# Patient Record
Sex: Male | Born: 1985
Health system: Southern US, Community
[De-identification: ages and names within clinical notes are randomized; demographics above are authoritative.]

## PROBLEM LIST (undated history)

## (undated) ENCOUNTER — Emergency Department (HOSPITAL_COMMUNITY): Admission: EM | Payer: Medicare HMO

## (undated) DIAGNOSIS — G8929 Other chronic pain: Secondary | ICD-10-CM

## (undated) DIAGNOSIS — M1990 Unspecified osteoarthritis, unspecified site: Secondary | ICD-10-CM

## (undated) DIAGNOSIS — F419 Anxiety disorder, unspecified: Secondary | ICD-10-CM

## (undated) DIAGNOSIS — M79673 Pain in unspecified foot: Secondary | ICD-10-CM

## (undated) HISTORY — DX: Other chronic pain: G89.29

## (undated) HISTORY — PX: SUBTALAR DISTRACTION BONE BLOCK FUSION WITH ILIAC CREST BONE GRAFT: SHX6362

## (undated) HISTORY — PX: FRACTURE SURGERY: SHX138

## (undated) HISTORY — DX: Anxiety disorder, unspecified: F41.9

## (undated) HISTORY — DX: Unspecified osteoarthritis, unspecified site: M19.90

## (undated) HISTORY — DX: Pain in unspecified foot: M79.673

---

## 2011-02-18 ENCOUNTER — Other Ambulatory Visit: Payer: Self-pay | Admitting: Podiatry

## 2011-02-18 DIAGNOSIS — M79672 Pain in left foot: Secondary | ICD-10-CM

## 2011-02-20 ENCOUNTER — Other Ambulatory Visit: Payer: Self-pay

## 2011-02-20 ENCOUNTER — Ambulatory Visit
Admission: RE | Admit: 2011-02-20 | Discharge: 2011-02-20 | Disposition: A | Discharge status: Home / Self Care | Payer: BC Managed Care – PPO | Source: Ambulatory Visit | Attending: Podiatry | Admitting: Podiatry

## 2011-02-20 DIAGNOSIS — M79672 Pain in left foot: Secondary | ICD-10-CM

## 2011-04-04 ENCOUNTER — Ambulatory Visit (INDEPENDENT_AMBULATORY_CARE_PROVIDER_SITE_OTHER): Payer: BC Managed Care – PPO | Admitting: Family Medicine

## 2011-04-04 ENCOUNTER — Encounter: Payer: Self-pay | Admitting: Family Medicine

## 2011-04-04 DIAGNOSIS — Z9889 Other specified postprocedural states: Secondary | ICD-10-CM | POA: Insufficient documentation

## 2011-04-04 DIAGNOSIS — J069 Acute upper respiratory infection, unspecified: Secondary | ICD-10-CM

## 2011-04-04 DIAGNOSIS — Z20828 Contact with and (suspected) exposure to other viral communicable diseases: Secondary | ICD-10-CM

## 2011-04-04 MED ORDER — OSELTAMIVIR PHOSPHATE 75 MG PO CAPS: 75.0000 mg | ORAL_CAPSULE | Freq: Every day | ORAL | Status: AC

## 2011-04-04 NOTE — Assessment & Plan Note (Signed)
No evidence of bacterial infxn on PE.  Reviewed supportive care and red flags that should prompt return.  Pt expressed understanding and is in agreement w/ plan.

## 2011-04-04 NOTE — Patient Instructions (Signed)
Schedule your physical at your convenience We'll call you with your Ortho appt Start the Tamiflu daily to prevent flu illness The current symptoms are due to a virus- dayquil/nyquil, sudafed, etc will help Drink plenty of fluids REST! Call with any questions or concerns Think of [place] as your home base! Welcome!  We're glad to have you!!!

## 2011-04-04 NOTE — Assessment & Plan Note (Signed)
Start Tamiflu empirically

## 2011-04-04 NOTE — Assessment & Plan Note (Signed)
Refer to Dr Lestine Box for 2nd opinion on foot surgery

## 2011-04-04 NOTE — Progress Notes (Signed)
  Subjective:    Patient ID: Douglas Nelson, male    DOB: June 07, 1985, 26 y.o.   MRN: [id number]  HPI New to establish.  Previous MD-   L foot surgery- 5/12, still walking w/ cane and in pain.  Needs 2nd opinion b/c surgeon admitted 'it was messed up'  URI- + flu exposure.  Started w/ head congestion on Tuesday.  Cough productive of mucous.  No body aches.  No fevers.  No facial pain/pressure, no ear pain.   Review of Systems For ROS see HPI     Objective:   Physical Exam  Vitals reviewed. Constitutional: He appears well-developed and well-nourished. No distress.  HENT:  Head: Normocephalic and atraumatic.       No TTP over sinuses + turbinate edema + PND TMs normal bilaterally  Eyes: Conjunctivae and EOM are normal. Pupils are equal, round, and reactive to light.  Neck: Normal range of motion. Neck supple.  Cardiovascular: Normal rate, regular rhythm and normal heart sounds.   Pulmonary/Chest: Effort normal and breath sounds normal. No respiratory distress. He has no wheezes.  Lymphadenopathy:    He has no cervical adenopathy.  Skin: Skin is warm and dry.          Assessment & Plan:

## 2011-06-04 ENCOUNTER — Ambulatory Visit: Payer: BC Managed Care – PPO | Admitting: Family Medicine

## 2011-06-04 DIAGNOSIS — Z0289 Encounter for other administrative examinations: Secondary | ICD-10-CM

## 2011-06-14 ENCOUNTER — Ambulatory Visit (INDEPENDENT_AMBULATORY_CARE_PROVIDER_SITE_OTHER): Payer: BC Managed Care – PPO | Admitting: Family Medicine

## 2011-06-14 ENCOUNTER — Encounter: Payer: Self-pay | Admitting: Family Medicine

## 2011-06-14 VITALS — BP 128/80 | HR 83 | Temp 98.4°F | Ht 72.0 in | Wt 207.8 lb

## 2011-06-14 DIAGNOSIS — J309 Allergic rhinitis, unspecified: Secondary | ICD-10-CM

## 2011-06-14 DIAGNOSIS — J302 Other seasonal allergic rhinitis: Secondary | ICD-10-CM

## 2011-06-14 DIAGNOSIS — R631 Polydipsia: Secondary | ICD-10-CM

## 2011-06-14 LAB — HEPATIC FUNCTION PANEL
ALT: 24 U/L (ref 0–53)
AST: 32 U/L (ref 0–37)
Albumin: 4.9 g/dL (ref 3.5–5.2)
Alkaline Phosphatase: 72 U/L (ref 39–117)
Bilirubin, Direct: 0 mg/dL (ref 0.0–0.3)
Total Bilirubin: 1.4 mg/dL — ABNORMAL HIGH (ref 0.3–1.2)
Total Protein: 8 g/dL (ref 6.0–8.3)

## 2011-06-14 LAB — BASIC METABOLIC PANEL
Calcium: 10.4 mg/dL (ref 8.4–10.5)
GFR: 135.9 mL/min (ref >60.00)
Glucose, Bld: 75 mg/dL (ref 70–99)
Sodium: 138 mEq/L (ref 135–145)

## 2011-06-14 LAB — TSH: TSH: 0.68 u[IU]/mL (ref 0.35–5.50)

## 2011-06-14 NOTE — Progress Notes (Signed)
  Subjective:    Patient ID: Douglas Nelson, male    DOB: 15-Apr-1985, 26 y.o.   MRN: [id number]  HPI Polydipsia, polyuria.  Having excessive fatigue 10-20 minutes after eating.  Sleeping well, eating healthy, taking vitamins.  sxs for 'a couple months'.  Grandparents w/ DM II.  Mom w/ hypoglycemia.  Has lost between 8-15 lbs in last few months.  + polyphagia.  URI- nasal congestion is brown, green.  sxs started Sunday.  + sore throat.  + PND.  No cough.  No fevers.  + outdoor exposure.  Review of Systems For ROS see HPI     Objective:   Physical Exam  Vitals reviewed. Constitutional: He appears well-developed and well-nourished. No distress.  HENT:  Head: Normocephalic and atraumatic.       No TTP over sinuses + turbinate edema + PND TMs normal bilaterally  Eyes: Conjunctivae and EOM are normal. Pupils are equal, round, and reactive to light.  Neck: Normal range of motion. Neck supple. No thyromegaly present.  Cardiovascular: Normal rate, regular rhythm, normal heart sounds and intact distal pulses.   Pulmonary/Chest: Effort normal and breath sounds normal. No respiratory distress. He has no wheezes.  Abdominal: Soft. Bowel sounds are normal. He exhibits no distension. There is no tenderness. There is no rebound and no guarding.  Musculoskeletal: He exhibits no edema.  Lymphadenopathy:    He has no cervical adenopathy.  Skin: Skin is warm and dry.          Assessment & Plan:

## 2011-06-14 NOTE — Assessment & Plan Note (Signed)
New.  Pt to start OTC antihistamine for sxs relief.

## 2011-06-14 NOTE — Assessment & Plan Note (Signed)
New.  Pt w/ textbook diabetes sxs- polydipsia, polyuria, polyphagia, weight loss- although doesn't physically appear the way type II typically manifests.  Not acute and ill like type I.  Check labs to determine if endocrine abnormality.  Will follow closely.

## 2011-06-14 NOTE — Patient Instructions (Signed)
We'll notify you of your lab results and decide on the next step Start the Claritin or Zyrtec daily for the allergies Call with any questions or concerns Hang in there!

## 2011-06-18 ENCOUNTER — Encounter: Payer: Self-pay | Admitting: *Deleted

## 2011-07-16 ENCOUNTER — Other Ambulatory Visit: Payer: Self-pay | Admitting: Orthopedic Surgery

## 2011-07-16 DIAGNOSIS — M1990 Unspecified osteoarthritis, unspecified site: Secondary | ICD-10-CM

## 2011-07-18 ENCOUNTER — Ambulatory Visit
Admission: RE | Admit: 2011-07-18 | Discharge: 2011-07-18 | Disposition: A | Discharge status: Home / Self Care | Payer: BC Managed Care – PPO | Source: Ambulatory Visit | Attending: Orthopedic Surgery | Admitting: Orthopedic Surgery

## 2011-07-18 DIAGNOSIS — M1990 Unspecified osteoarthritis, unspecified site: Secondary | ICD-10-CM

## 2011-07-18 MED ORDER — IOHEXOL 180 MG/ML  SOLN
1.0000 mL | Freq: Once | INTRAMUSCULAR | Status: AC | PRN
  Administered 2011-07-18: 1 mL via INTRA_ARTICULAR

## 2011-07-18 MED ORDER — METHYLPREDNISOLONE ACETATE 40 MG/ML INJ SUSP (RADIOLOG
120.0000 mg | Freq: Once | INTRAMUSCULAR | Status: AC
  Administered 2011-07-18: 120 mg via INTRA_ARTICULAR

## 2011-09-16 DIAGNOSIS — Q6689 Other  specified congenital deformities of feet: Secondary | ICD-10-CM | POA: Insufficient documentation

## 2012-06-10 ENCOUNTER — Encounter: Payer: Self-pay | Admitting: Family Medicine

## 2012-06-10 ENCOUNTER — Ambulatory Visit (INDEPENDENT_AMBULATORY_CARE_PROVIDER_SITE_OTHER): Payer: BC Managed Care – PPO | Admitting: Family Medicine

## 2012-06-10 VITALS — BP 120/60 | HR 81 | Temp 98.1°F | Wt 204.8 lb

## 2012-06-10 DIAGNOSIS — Z308 Encounter for other contraceptive management: Secondary | ICD-10-CM

## 2012-06-10 DIAGNOSIS — Z9889 Other specified postprocedural states: Secondary | ICD-10-CM

## 2012-06-10 DIAGNOSIS — Z9189 Other specified personal risk factors, not elsewhere classified: Secondary | ICD-10-CM

## 2012-06-10 DIAGNOSIS — Z202 Contact with and (suspected) exposure to infections with a predominantly sexual mode of transmission: Secondary | ICD-10-CM | POA: Insufficient documentation

## 2012-06-10 NOTE — Patient Instructions (Addendum)
We'll get in touch w/ Dr Azzie Glatter and figure out the plan for the meds We'll notify you of your lab results Someone will call you with your urology appt Call with any questions or concerns CONGRATS on the engagement!

## 2012-06-10 NOTE — Assessment & Plan Note (Signed)
New.  Pt would like testing as part of an agreement between he and his fiance.  Check labs.

## 2012-06-10 NOTE — Progress Notes (Signed)
  Subjective:    Patient ID: Douglas Nelson, male    DOB: 1985-08-08, 27 y.o.   MRN: [id number]  HPI STD testing- pt reports he is not overly concerned but he and finance agreed to do this.  No discharge, no pain.  Pain management- pt had foot surgery at [facility] in December.  Saw Dr Azzie Glatter in [place] today (pain management) and pt was told to f/u here since he doesn't live in [place].    Pt desires vasectomy.   Review of Systems For ROS see HPI     Objective:   Physical Exam  Vitals reviewed. Constitutional: He is oriented to person, place, and time. He appears well-developed and well-nourished. No distress.  Neurological: He is alert and oriented to person, place, and time.  Skin: Skin is warm and dry.  Psychiatric: He has a normal mood and affect. His behavior is normal.          Assessment & Plan:

## 2012-06-10 NOTE — Assessment & Plan Note (Signed)
New.  Pain management requested pt f/u locally for pain meds.  Awaiting fax from Dr Azzie Glatter at [facility] to determine next steps.

## 2012-06-10 NOTE — Assessment & Plan Note (Signed)
New.  Pt desires vasectomy.  Will refer to urology.

## 2012-06-11 ENCOUNTER — Telehealth: Payer: Self-pay | Admitting: *Deleted

## 2012-06-11 LAB — GC/CHLAMYDIA PROBE AMP, URINE: GC Probe Amp, Urine: NEGATIVE

## 2012-06-11 LAB — HSV(HERPES SMPLX)ABS-I+II(IGG+IGM)-BLD
HSV 1 Glycoprotein G Ab, IgG: 2.16 IV — ABNORMAL HIGH
HSV 2 Glycoprotein G Ab, IgG: <0.1 IV
Herpes Simplex Vrs I&II-IgM Ab (EIA): 0.28 INDEX

## 2012-06-11 NOTE — Telephone Encounter (Signed)
I have not seen any forms from [facility]- could we please contact them again for the info

## 2012-06-11 NOTE — Telephone Encounter (Signed)
Pt called requesting a copy of lab results. Pt also wanted to check the status of the paperwork from [facility] pain mgmt to see if that has been handled. Pt is coming by the office tomorrow to pick up lab results (already placed at front desk) & would also like to pick up a prescription for oxycodone 10-325mg . Please advise.

## 2012-06-12 ENCOUNTER — Telehealth: Payer: Self-pay | Admitting: Family Medicine

## 2012-06-12 ENCOUNTER — Other Ambulatory Visit: Payer: Self-pay | Admitting: Family Medicine

## 2012-06-12 MED ORDER — OXYCODONE-ACETAMINOPHEN 10-325 MG PO TABS: 1.0000 | ORAL_TABLET | Freq: Two times a day (BID) | ORAL | Status: DC | PRN

## 2012-06-12 NOTE — Telephone Encounter (Signed)
Pt comes by the office today and informed him that we faxed record release to [facility] but have not hear anything from them.  Told the pt I will call them and fax the release again.  Informed the pt that Dr. Beverely Low will give him a rx for the Oxycodone 10-325mg  for now until we hear from [facility].  Pt agreed and rx given.  Faxed record release again.//AB/CMA

## 2012-06-12 NOTE — Telephone Encounter (Signed)
PT stated that his lab results and rx were suppose to be left up front for him yesterday. Please give him a call back thanks

## 2012-06-15 NOTE — Telephone Encounter (Signed)
Pt came in and picked up copy of lab results and also picked up rx for pain med.//AB/CMA

## 2012-06-17 ENCOUNTER — Other Ambulatory Visit: Payer: Self-pay | Admitting: Family Medicine

## 2012-06-17 MED ORDER — OXYCODONE HCL 5 MG PO TABS: 5.0000 mg | ORAL_TABLET | ORAL | Status: DC | PRN

## 2012-06-19 ENCOUNTER — Telehealth: Payer: Self-pay | Admitting: *Deleted

## 2012-06-19 NOTE — Telephone Encounter (Signed)
Angie had been in contact w/ [facility]- have yet to see the fax they were supposed to send.  Will wait for Angie to return.  We decreased his dose of Oxycontin to 5 mg and when he is next due for meds, will be happy to decrease to hydrocodone rather than oxycodone.

## 2012-06-19 NOTE — Telephone Encounter (Signed)
If pt would like to return the Oxycontin script we can replace it w/ Hydrocodone 5mg  TID prn.  #60, no refills

## 2012-06-19 NOTE — Telephone Encounter (Signed)
Spoke with Pt who states that this issue has already been addressed.

## 2012-06-19 NOTE — Telephone Encounter (Signed)
Pt left VM wanting to know if Dr Beverely Low has been in contact with Dr Arbutus Ped at [facility] about pain med. Pt states that Dr Arbutus Ped was wanting Pt to take some not as strong.Please advise

## 2012-07-09 ENCOUNTER — Telehealth: Payer: Self-pay | Admitting: *Deleted

## 2012-07-09 NOTE — Telephone Encounter (Signed)
Last OV 06-10-12, last filled 06-17-12 #60

## 2012-07-09 NOTE — Telephone Encounter (Signed)
Ok for #60 but please ask pt about whether he has an upcoming appt w/ Pain management for his psych testing as recommended at initial visit.

## 2012-07-10 MED ORDER — OXYCODONE HCL 5 MG PO TABS: 5.0000 mg | ORAL_TABLET | ORAL | Status: DC | PRN

## 2012-07-10 NOTE — Telephone Encounter (Signed)
Spoke with the pt and informed him the med refill request has been approved.  Informed the pt that we received the records from [facility] and from the notes he was to have an UDS and med psych eval.  Asked the pt was this done, and has he had the f/u with [facility] 1st week of May with the plan for Dr. Beverely Low to assume care after.  Pt stated that the UDS was done on (06/10/12), and he got a call from [facility] on yest and they are scheduling him a appt looking at Tues (07/14/12).  They are trying to get his f/u and med psych eval the same day.  Informed him his rx will be put up front for him to pick-up.//AB/CMA

## 2012-07-28 ENCOUNTER — Telehealth: Payer: Self-pay | Admitting: General Practice

## 2012-07-28 MED ORDER — OXYCODONE HCL 5 MG PO TABS: 5.0000 mg | ORAL_TABLET | ORAL | Status: DC | PRN

## 2012-07-28 NOTE — Telephone Encounter (Signed)
Pt called stating that he needs a refill of his Oxycodone. Pt last seen on 06-10-12 and med last filled on 07-10-12 #60. Please advise.

## 2012-07-28 NOTE — Telephone Encounter (Signed)
Ok for #60, no refills 

## 2012-07-28 NOTE — Telephone Encounter (Signed)
Rx printed and put up front for the pt to pick-up.   Spoke with the pt and informed him that rx is ready to be picked up.//AB/CMA

## 2012-08-12 ENCOUNTER — Telehealth: Payer: Self-pay | Admitting: General Practice

## 2012-08-12 NOTE — Telephone Encounter (Signed)
Is he taking this regularly every 4-6 hr or only as needed for severe pain.  If taking regularly, we need to switch him to extended release tabs.  If it's only as needed, should not be requiring 60 pills in 15 days

## 2012-08-12 NOTE — Telephone Encounter (Signed)
oxyCODONE (OXY IR/ROXICODONE) 5 MG immediate release tablet Last OV 06-10-12 Med last filled on 07-28-12 #60 with 0 refills

## 2012-08-13 NOTE — Telephone Encounter (Signed)
Pt states he takes med every 4 hours.

## 2012-08-14 MED ORDER — OXYCODONE HCL ER 10 MG PO T12A: 10.0000 mg | EXTENDED_RELEASE_TABLET | Freq: Two times a day (BID) | ORAL | Status: DC

## 2012-08-14 NOTE — Telephone Encounter (Signed)
Pt notified Rx available at the front desk for pick up.

## 2012-08-14 NOTE — Telephone Encounter (Signed)
Based on the fact that he's requiring meds every 4 hrs, needs to switch to Oxycontin ER 10mg  BID, #60 no refills

## 2012-08-26 ENCOUNTER — Telehealth: Payer: Self-pay | Admitting: *Deleted

## 2012-08-26 NOTE — Telephone Encounter (Signed)
We can complete a form for him- please get form and I will sign

## 2012-08-26 NOTE — Telephone Encounter (Signed)
Pt left VM that he would like to get a form competed to apply for a handicap sticker.Pt notes that he has already been parking in the spots already but would like to make it official .Please advise

## 2012-08-27 NOTE — Telephone Encounter (Signed)
Formed placed up front for pick up left Pt detail VM

## 2012-08-31 ENCOUNTER — Ambulatory Visit (INDEPENDENT_AMBULATORY_CARE_PROVIDER_SITE_OTHER): Payer: BC Managed Care – PPO | Admitting: Family Medicine

## 2012-08-31 ENCOUNTER — Encounter: Payer: Self-pay | Admitting: Family Medicine

## 2012-08-31 VITALS — BP 110/70 | HR 80 | Temp 98.3°F | Wt 188.6 lb

## 2012-08-31 DIAGNOSIS — J019 Acute sinusitis, unspecified: Secondary | ICD-10-CM

## 2012-08-31 MED ORDER — AMOXICILLIN 875 MG PO TABS: 875.0000 mg | ORAL_TABLET | Freq: Two times a day (BID) | ORAL | Status: DC

## 2012-08-31 MED ORDER — PROMETHAZINE-DM 6.25-15 MG/5ML PO SYRP: 5.0000 mL | ORAL_SOLUTION | Freq: Four times a day (QID) | ORAL | Status: DC | PRN

## 2012-08-31 NOTE — Progress Notes (Signed)
  Subjective:    Patient ID: Douglas Nelson, male    DOB: [birth year], 27 y.o.   MRN: [id number]  HPI URI- sxs started 1 week ago.  No fevers.  + chills.  + nasal congestion, PND, sore throat.  + facial pain/pressure.  + hoarseness.  Started cough last night- productive.  No known sick contacts.  Painful to lean forward, no tooth pain.  No N/V/D.   Review of Systems For ROS see HPI     Objective:   Physical Exam  Vitals reviewed. Constitutional: He appears well-developed and well-nourished. No distress.  HENT:  Head: Normocephalic and atraumatic.  Right Ear: Tympanic membrane normal.  Left Ear: Tympanic membrane normal.  Nose: Mucosal edema and rhinorrhea present. Right sinus exhibits maxillary sinus tenderness and frontal sinus tenderness. Left sinus exhibits maxillary sinus tenderness and frontal sinus tenderness.  Mouth/Throat: Mucous membranes are normal. Oropharyngeal exudate and posterior oropharyngeal erythema present. No posterior oropharyngeal edema.  + PND  Eyes: Conjunctivae and EOM are normal. Pupils are equal, round, and reactive to light.  Neck: Normal range of motion. Neck supple.  Cardiovascular: Normal rate, regular rhythm and normal heart sounds.   Pulmonary/Chest: Effort normal and breath sounds normal. No respiratory distress. He has no wheezes.  + hacking cough  Lymphadenopathy:    He has no cervical adenopathy.  Skin: Skin is warm and dry.          Assessment & Plan:

## 2012-08-31 NOTE — Patient Instructions (Addendum)
This is a sinus infection Start the Amox twice daily- take w/ food Drink plenty of fluids Use the cough syrup at night- will make you drowsy Mucinex DM for daytime cough REST! Hang in there!!!

## 2012-08-31 NOTE — Assessment & Plan Note (Signed)
New.  sxs and PE consistent w/ infxn.  Start abx.  Cough meds prn.  Reviewed supportive care and red flags that should prompt return.  Pt expressed understanding and is in agreement w/ plan.

## 2012-09-21 ENCOUNTER — Telehealth: Payer: Self-pay | Admitting: *Deleted

## 2012-09-21 ENCOUNTER — Other Ambulatory Visit: Payer: Self-pay | Admitting: Family Medicine

## 2012-09-21 MED ORDER — OXYCODONE HCL 5 MG PO TABS: 5.0000 mg | ORAL_TABLET | Freq: Three times a day (TID) | ORAL | Status: DC | PRN

## 2012-09-21 NOTE — Progress Notes (Signed)
Script printed.

## 2012-09-21 NOTE — Telephone Encounter (Signed)
Pt left detail VM Rx ready for pick up Per Angie. rx placed up front.

## 2012-09-21 NOTE — Telephone Encounter (Signed)
Pt left VM that he would like to switch back to the oxycodone. Pt indicated that the oxycontin is too strong and he does not like the way it makes him feels. Pt notes that it does help the pain but it is too strong so he would like to go back to the oxycodone and take motrin for the break thru pain.Please advise

## 2012-09-21 NOTE — Telephone Encounter (Signed)
Script printed for short acting meds.  Pt will take this 3x/day rather than every 4 hrs like before and then motrin as needed- pt needs UDS

## 2012-09-22 ENCOUNTER — Encounter: Payer: Self-pay | Admitting: Lab

## 2012-10-02 ENCOUNTER — Encounter: Payer: Self-pay | Admitting: Family Medicine

## 2012-10-23 ENCOUNTER — Telehealth: Payer: Self-pay | Admitting: *Deleted

## 2012-10-23 DIAGNOSIS — G894 Chronic pain syndrome: Secondary | ICD-10-CM

## 2012-10-23 MED ORDER — OXYCODONE HCL 5 MG PO TABS: 5.0000 mg | ORAL_TABLET | Freq: Three times a day (TID) | ORAL | Status: DC | PRN

## 2012-10-23 NOTE — Telephone Encounter (Signed)
Rx printed and pt notified it is ready to be picked up

## 2012-10-23 NOTE — Telephone Encounter (Signed)
Ok for #90, no refills 

## 2012-10-23 NOTE — Telephone Encounter (Signed)
Patient is requesting refill on Oxycodone 5mg   Last OV 08/31/12 Last filled 09/21/12 #90 UDS-Low Risk Agreement on file  Okay to refill?

## 2012-11-23 ENCOUNTER — Other Ambulatory Visit: Payer: Self-pay | Admitting: General Practice

## 2012-11-23 DIAGNOSIS — G894 Chronic pain syndrome: Secondary | ICD-10-CM

## 2012-11-23 MED ORDER — OXYCODONE HCL 5 MG PO TABS: 5.0000 mg | ORAL_TABLET | Freq: Three times a day (TID) | ORAL | Status: DC | PRN

## 2012-11-23 NOTE — Telephone Encounter (Signed)
NOted pt advised.

## 2012-11-23 NOTE — Telephone Encounter (Signed)
oxyCODONE (OXY IR/ROXICODONE) 5 MG immediate release tablet Last OV 08-31-12 Med last filled 10-23-12 #90 with 0 refills.   Controlled Substance Contract and UDS up to date Low Risk

## 2012-12-14 DIAGNOSIS — Z981 Arthrodesis status: Secondary | ICD-10-CM | POA: Diagnosis not present

## 2012-12-14 DIAGNOSIS — Z9889 Other specified postprocedural states: Secondary | ICD-10-CM | POA: Diagnosis not present

## 2012-12-14 DIAGNOSIS — M79609 Pain in unspecified limb: Secondary | ICD-10-CM | POA: Diagnosis not present

## 2012-12-14 DIAGNOSIS — IMO0002 Reserved for concepts with insufficient information to code with codable children: Secondary | ICD-10-CM | POA: Diagnosis not present

## 2012-12-14 DIAGNOSIS — M76899 Other specified enthesopathies of unspecified lower limb, excluding foot: Secondary | ICD-10-CM | POA: Diagnosis not present

## 2012-12-28 ENCOUNTER — Telehealth: Payer: Self-pay | Admitting: *Deleted

## 2012-12-28 DIAGNOSIS — G894 Chronic pain syndrome: Secondary | ICD-10-CM

## 2012-12-28 MED ORDER — OXYCODONE HCL 5 MG PO TABS: 5.0000 mg | ORAL_TABLET | Freq: Three times a day (TID) | ORAL | Status: DC | PRN

## 2012-12-28 NOTE — Telephone Encounter (Signed)
Med refilled.

## 2012-12-28 NOTE — Telephone Encounter (Signed)
Last visit: 08/31/2012  Last filled: 11/23/2012, #90, 0 refills  UDS: 09/22/2012-low risk, contract signed  Please advise. SW, CMA

## 2012-12-28 NOTE — Telephone Encounter (Signed)
Refill x1 

## 2013-01-26 ENCOUNTER — Ambulatory Visit (INDEPENDENT_AMBULATORY_CARE_PROVIDER_SITE_OTHER): Payer: Medicare Other | Admitting: Family Medicine

## 2013-01-26 ENCOUNTER — Encounter: Payer: Self-pay | Admitting: Family Medicine

## 2013-01-26 VITALS — BP 130/60 | HR 72 | Temp 98.1°F | Wt 199.0 lb

## 2013-01-26 DIAGNOSIS — J069 Acute upper respiratory infection, unspecified: Secondary | ICD-10-CM | POA: Diagnosis not present

## 2013-01-26 MED ORDER — FLUTICASONE PROPIONATE 50 MCG/ACT NA SUSP: 2.0000 | Freq: Every day | NASAL | Status: DC

## 2013-01-26 MED ORDER — LORATADINE 10 MG PO TABS: 10.0000 mg | ORAL_TABLET | Freq: Every day | ORAL | Status: DC

## 2013-01-26 MED ORDER — FLUTICASONE PROPIONATE 50 MCG/ACT NA SUSP: NASAL | Status: DC

## 2013-01-26 NOTE — Progress Notes (Signed)
  Subjective:     Douglas Nelson is a 27 y.o. male who presents for evaluation of symptoms of a URI. Symptoms include bilateral ear pressure/pain, facial pain, nasal congestion, post nasal drip and sinus pressure. Onset of symptoms was 8 hours ago, and has been gradually worsening since that time. Treatment to date: none.  The following portions of the patient's history were reviewed and updated as appropriate: allergies, current medications, past family history, past medical history, past social history, past surgical history and problem list.  Review of Systems Pertinent items are noted in HPI.   Objective:    BP 130/60  Pulse 72  Temp(Src) 98.1 F (36.7 C) (Oral)  Wt 199 lb (90.266 kg)  SpO2 98% General appearance: alert, cooperative, appears stated age and no distress Ears: normal TM's and external ear canals both ears Nose: clear discharge, mild congestion, turbinates red, swollen, sinus tenderness bilateral Throat: abnormal findings: mild oropharyngeal erythema and pnd Neck: no adenopathy, supple, symmetrical, trachea midline and thyroid not enlarged, symmetric, no tenderness/mass/nodules Lungs: clear to auscultation bilaterally Heart: S1, S2 normal   Assessment:    viral upper respiratory illness   Plan:    Discussed diagnosis and treatment of URI. Suggested symptomatic OTC remedies. Nasal saline spray for congestion. Nasal steroids per orders. Follow up as needed. claritin or other antihistamine

## 2013-01-26 NOTE — Progress Notes (Signed)
Pre visit review using our clinic review tool, if applicable. No additional management support is needed unless otherwise documented below in the visit note. 

## 2013-01-26 NOTE — Patient Instructions (Signed)

## 2013-02-09 ENCOUNTER — Telehealth: Payer: Self-pay | Admitting: *Deleted

## 2013-02-09 DIAGNOSIS — G894 Chronic pain syndrome: Secondary | ICD-10-CM

## 2013-02-09 MED ORDER — OXYCODONE HCL 5 MG PO TABS: 5.0000 mg | ORAL_TABLET | Freq: Three times a day (TID) | ORAL | Status: DC | PRN

## 2013-02-09 NOTE — Telephone Encounter (Signed)
Last seen-01/26/2013  Last filled-12/28/2012  UDS-09/22/2012 low risk, contract signed  Please advise. SW

## 2013-02-09 NOTE — Telephone Encounter (Signed)
Med filled. Patient notified

## 2013-02-09 NOTE — Telephone Encounter (Signed)
Ok for refill? 

## 2013-03-24 ENCOUNTER — Telehealth: Payer: Self-pay | Admitting: *Deleted

## 2013-03-24 DIAGNOSIS — G894 Chronic pain syndrome: Secondary | ICD-10-CM

## 2013-03-24 MED ORDER — OXYCODONE HCL 5 MG PO TABS: 5.0000 mg | ORAL_TABLET | Freq: Three times a day (TID) | ORAL | Status: DC | PRN

## 2013-03-24 NOTE — Telephone Encounter (Signed)
Ok for 90

## 2013-03-24 NOTE — Telephone Encounter (Signed)
oxyCODONE (OXY IR/ROXICODONE) 5 MG immediate release tablet Last refill: 02/09/13 #90, 0 refills Last OV: 01/26/2013 Last UDS: 09/22/12, low risk

## 2013-03-24 NOTE — Telephone Encounter (Signed)
Med filled, pt called cannot leave a message due to voicemail not set up.

## 2013-03-24 NOTE — Telephone Encounter (Signed)
Med filled, pt notified.  

## 2013-05-20 ENCOUNTER — Telehealth: Payer: Self-pay | Admitting: *Deleted

## 2013-05-20 DIAGNOSIS — G894 Chronic pain syndrome: Secondary | ICD-10-CM

## 2013-05-20 MED ORDER — OXYCODONE HCL 5 MG PO TABS: 5.0000 mg | ORAL_TABLET | Freq: Three times a day (TID) | ORAL | Status: DC | PRN

## 2013-05-20 NOTE — Addendum Note (Signed)
Addended by: Jackson LatinoYLER, JESSICA L on: 05/20/2013 04:57 PM   Modules accepted: Orders

## 2013-05-20 NOTE — Telephone Encounter (Signed)
Ok for #90, no refills 

## 2013-05-20 NOTE — Telephone Encounter (Signed)
Patient left message on triage line requesting refill on oxycodone. Last OV 01/26/13 Last filled 03/24/13 #90 Contract on file UDS low risk Okay to refill?

## 2013-05-20 NOTE — Telephone Encounter (Signed)
Med filled.  

## 2013-07-06 ENCOUNTER — Telehealth: Payer: Self-pay | Admitting: Family Medicine

## 2013-07-06 DIAGNOSIS — G894 Chronic pain syndrome: Secondary | ICD-10-CM

## 2013-07-06 NOTE — Telephone Encounter (Signed)
Caller name:Yerick Clifton CustardAaron  Relation to ZO:XWRUEAVpt:patient Call back number:[phone] Pharmacy:  Reason for call: to request a refill for oxyCODONE (OXY IR/ROXICODONE) 5 MG immediate release tablet

## 2013-07-07 DIAGNOSIS — M79609 Pain in unspecified limb: Secondary | ICD-10-CM | POA: Diagnosis not present

## 2013-07-07 DIAGNOSIS — M25579 Pain in unspecified ankle and joints of unspecified foot: Secondary | ICD-10-CM | POA: Diagnosis not present

## 2013-07-07 MED ORDER — OXYCODONE HCL 5 MG PO TABS: 5.0000 mg | ORAL_TABLET | Freq: Three times a day (TID) | ORAL | Status: DC | PRN

## 2013-07-07 NOTE — Telephone Encounter (Signed)
Patient requesting refill on Oxy-IR Last OV 01/26/13 Last filled 05/20/13 #90 0 RF Contract on file UDS low risk  Okay to refill?

## 2013-07-07 NOTE — Telephone Encounter (Signed)
Med filled.  

## 2013-07-07 NOTE — Telephone Encounter (Signed)
Ok for #90, no refills 

## 2013-09-02 ENCOUNTER — Telehealth: Payer: Self-pay | Admitting: Family Medicine

## 2013-09-02 DIAGNOSIS — G894 Chronic pain syndrome: Secondary | ICD-10-CM

## 2013-09-02 MED ORDER — OXYCODONE HCL 5 MG PO TABS: 5.0000 mg | ORAL_TABLET | Freq: Three times a day (TID) | ORAL | Status: DC | PRN

## 2013-09-02 NOTE — Telephone Encounter (Signed)
Caller name: Douglas Nelson Relation to pt: patient Call back number: [phone] Pharmacy:  Reason for call: to request a refill for  oxyCODONE (OXY IR/ROXICODONE) 5 MG immediate

## 2013-09-02 NOTE — Telephone Encounter (Signed)
Last OV 08-31-12 (sinus) 07-07-13 #90 with 0

## 2013-09-02 NOTE — Telephone Encounter (Signed)
Med filled.  

## 2013-09-02 NOTE — Telephone Encounter (Signed)
Ok for 90

## 2013-10-28 ENCOUNTER — Telehealth: Payer: Self-pay | Admitting: Family Medicine

## 2013-10-28 DIAGNOSIS — G894 Chronic pain syndrome: Secondary | ICD-10-CM

## 2013-10-28 MED ORDER — OXYCODONE HCL 5 MG PO TABS: 5.0000 mg | ORAL_TABLET | Freq: Three times a day (TID) | ORAL | Status: DC | PRN

## 2013-10-28 NOTE — Telephone Encounter (Signed)
Caller name: [place] Relation to pt: Call back number:[phone] Pharmacy:  Reason for call: wants refill on rx oxyCODONE (OXY IR/ROXICODONE) 5 MG immediate release tablet

## 2013-10-28 NOTE — Telephone Encounter (Signed)
Med filled and pt notified.  

## 2013-10-28 NOTE — Telephone Encounter (Signed)
Last OV 08-31-12 Oxy filled 09-02-13 #90 with 0  Due for UDS

## 2013-10-28 NOTE — Telephone Encounter (Signed)
Ok for #90, needs UDS.  No additional refills w/o appt (has been over 1 year)

## 2013-11-08 ENCOUNTER — Encounter: Payer: Self-pay | Admitting: Family Medicine

## 2013-11-08 ENCOUNTER — Ambulatory Visit (INDEPENDENT_AMBULATORY_CARE_PROVIDER_SITE_OTHER): Payer: Medicare Other | Admitting: Family Medicine

## 2013-11-08 VITALS — BP 112/80 | HR 67 | Temp 98.2°F | Resp 16 | Ht 71.0 in | Wt 204.4 lb

## 2013-11-08 DIAGNOSIS — E663 Overweight: Secondary | ICD-10-CM | POA: Diagnosis not present

## 2013-11-08 DIAGNOSIS — Z9889 Other specified postprocedural states: Secondary | ICD-10-CM | POA: Diagnosis not present

## 2013-11-08 DIAGNOSIS — Z0000 Encounter for general adult medical examination without abnormal findings: Secondary | ICD-10-CM | POA: Diagnosis not present

## 2013-11-08 DIAGNOSIS — Z6825 Body mass index (BMI) 25.0-25.9, adult: Secondary | ICD-10-CM | POA: Diagnosis not present

## 2013-11-08 DIAGNOSIS — Z79899 Other long term (current) drug therapy: Secondary | ICD-10-CM

## 2013-11-08 LAB — BASIC METABOLIC PANEL
BUN: 16 mg/dL (ref 6–23)
CO2: 31 meq/L (ref 19–32)
CREATININE: 0.9 mg/dL (ref 0.4–1.5)
Calcium: 9.8 mg/dL (ref 8.4–10.5)
Chloride: 100 mEq/L (ref 96–112)
GFR: 126.7 mL/min (ref >60.00)
GLUCOSE: 74 mg/dL (ref 70–99)
Potassium: 3.7 mEq/L (ref 3.5–5.1)
Sodium: 138 mEq/L (ref 135–145)

## 2013-11-08 LAB — LIPID PANEL
CHOLESTEROL: 252 mg/dL — AB (ref 0–200)
HDL: 65.7 mg/dL (ref >39.00)
LDL Cholesterol: 166 mg/dL — ABNORMAL HIGH (ref 0–99)
NonHDL: 186.3
Total CHOL/HDL Ratio: 4
Triglycerides: 100 mg/dL (ref 0.0–149.0)
VLDL: 20 mg/dL (ref 0.0–40.0)

## 2013-11-08 LAB — HEPATIC FUNCTION PANEL
ALBUMIN: 4.3 g/dL (ref 3.5–5.2)
ALT: 30 U/L (ref 0–53)
AST: 41 U/L — ABNORMAL HIGH (ref 0–37)
Alkaline Phosphatase: 58 U/L (ref 39–117)
Bilirubin, Direct: 0.1 mg/dL (ref 0.0–0.3)
TOTAL PROTEIN: 7.6 g/dL (ref 6.0–8.3)
Total Bilirubin: 1.6 mg/dL — ABNORMAL HIGH (ref 0.2–1.2)

## 2013-11-08 NOTE — Progress Notes (Signed)
Pre visit review using our clinic review tool, if applicable. No additional management support is needed unless otherwise documented below in the visit note. 

## 2013-11-08 NOTE — Assessment & Plan Note (Signed)
Pt now w/ chronic pain after multiple foot surgeries.  Maintained on narcotics.  Will continue to prescribe for pt.

## 2013-11-08 NOTE — Progress Notes (Signed)
   Subjective:    Patient ID: Douglas Nelson, male    DOB: August 27, 1985, 28 y.o.   MRN: [id number]  HPI Here today for CPE.  Risk Factors: Chronic foot pain- managing w/ chronic narcotics Physical Activity: limited due to foot pain and previous surgery Fall Risk: low Depression: denies current symptoms Hearing: normal to conversational tones and whispered voice at 6 ft ADL's: independent Cognitive: normal linear thought process, memory and attention intact Home Safety: safe at home, lives w/ wife Height, Weight, BMI, Visual Acuity: see vitals, vision corrected to 20/20 w/ glasses Counseling: UTD on eye exam, too young for colonoscopy or prostate exam Labs Ordered: See A&P Care Plan: See A&P    Review of Systems Patient reports no vision/hearing changes, anorexia, fever ,adenopathy, persistant/recurrent hoarseness, swallowing issues, chest pain, palpitations, edema, persistant/recurrent cough, hemoptysis, dyspnea (rest,exertional, paroxysmal nocturnal), gastrointestinal  bleeding (melena, rectal bleeding), abdominal pain, excessive heart burn, GU symptoms (dysuria, hematuria, voiding/incontinence issues) syncope, focal weakness, memory loss, numbness & tingling, skin/hair/nail changes, depression, anxiety, abnormal bruising/bleeding, musculoskeletal symptoms/signs.     Objective:   Physical Exam General Appearance:    Alert, cooperative, no distress, appears stated age  Head:    Normocephalic, without obvious abnormality, atraumatic  Eyes:    PERRL, conjunctiva/corneas clear, EOM's intact, fundi    benign, both eyes       Ears:    Normal TM's and external ear canals, both ears  Nose:   Nares normal, septum midline, mucosa normal, no drainage   or sinus tenderness  Throat:   Lips, mucosa, and tongue normal; teeth and gums normal  Neck:   Supple, symmetrical, trachea midline, no adenopathy;       thyroid:  No enlargement/tenderness/nodules  Back:     Symmetric, no curvature, ROM  normal, no CVA tenderness  Lungs:     Clear to auscultation bilaterally, respirations unlabored  Chest wall:    No tenderness or deformity  Heart:    Regular rate and rhythm, S1 and S2 normal, no murmur, rub   or gallop  Abdomen:     Soft, non-tender, bowel sounds active all four quadrants,    no masses, no organomegaly  Genitalia:    Normal male without lesion, masses,discharge or tenderness  Rectal:    Deferred due to young age  Extremities:   Extremities normal, atraumatic, no cyanosis or edema  Pulses:   2+ and symmetric all extremities  Skin:   Skin color, texture, turgor normal, no rashes or lesions  Lymph nodes:   Cervical, supraclavicular, and axillary nodes normal  Neurologic:   CNII-XII intact. Normal strength, sensation and reflexes      throughout          Assessment & Plan:

## 2013-11-08 NOTE — Patient Instructions (Signed)
Follow up in 1 year or as needed Keep up the good work!  You look great! We'll notify you of your lab results and make any changes if needed Call with any questions or concerns Happy Labor Day!

## 2013-11-08 NOTE — Assessment & Plan Note (Signed)
New.  Suspect this BMI is actually inaccurate due to pt's extensive muscle mass.  Will check labs to risk stratify.  Will follow.

## 2013-11-08 NOTE — Assessment & Plan Note (Signed)
Pt's PE WNL.  Too young for colonoscopy or DRE/PSA.  Check labs.  Anticipatory guidance provided.

## 2013-11-09 ENCOUNTER — Encounter: Payer: Self-pay | Admitting: General Practice

## 2013-12-16 DIAGNOSIS — M79672 Pain in left foot: Secondary | ICD-10-CM | POA: Diagnosis not present

## 2013-12-16 DIAGNOSIS — Z9889 Other specified postprocedural states: Secondary | ICD-10-CM | POA: Diagnosis not present

## 2013-12-16 DIAGNOSIS — Z981 Arthrodesis status: Secondary | ICD-10-CM | POA: Diagnosis not present

## 2013-12-27 ENCOUNTER — Telehealth: Payer: Self-pay | Admitting: Family Medicine

## 2013-12-27 DIAGNOSIS — G894 Chronic pain syndrome: Secondary | ICD-10-CM

## 2013-12-27 MED ORDER — OXYCODONE HCL 5 MG PO TABS: 5.0000 mg | ORAL_TABLET | Freq: Three times a day (TID) | ORAL | Status: DC | PRN

## 2013-12-27 NOTE — Telephone Encounter (Signed)
Med filled and pt notified.  

## 2013-12-27 NOTE — Telephone Encounter (Signed)
Caller name: Kurka, Swazil[place]Jordan M Relation to pt: self  Call back number: [phone]   Reason for call: pt requesting a refill oxyCODONE (OXY IR/ROXICODONE) 5 MG immediate release tablet

## 2013-12-27 NOTE — Telephone Encounter (Signed)
Last OV 11-08-13 Oxy last filled 10/28/13 #90 with 0

## 2013-12-27 NOTE — Telephone Encounter (Signed)
Ok for 90

## 2014-02-01 ENCOUNTER — Telehealth: Payer: Self-pay | Admitting: Family Medicine

## 2014-02-01 DIAGNOSIS — G894 Chronic pain syndrome: Secondary | ICD-10-CM

## 2014-02-01 MED ORDER — OXYCODONE HCL 5 MG PO TABS: 5.0000 mg | ORAL_TABLET | Freq: Three times a day (TID) | ORAL | Status: DC | PRN

## 2014-02-01 NOTE — Telephone Encounter (Signed)
Med filled and pt notified rx available to pick up.

## 2014-02-01 NOTE — Telephone Encounter (Signed)
Caller name:Lucchesi, [place] Relation to RU:EAVWpt:self Call back number:[phone] Pharmacy:  Reason for call: pt is needing rx oxyCODONE (OXY IR/ROXICODONE) 5 MG immediate release tablet, please call pt when available and ready for pick up.

## 2014-02-01 NOTE — Addendum Note (Signed)
Addended by: Jackson LatinoYLER, Jayland Null L on: 02/01/2014 11:47 AM   Modules accepted: Orders

## 2014-03-30 ENCOUNTER — Telehealth: Payer: Self-pay | Admitting: Family Medicine

## 2014-03-30 DIAGNOSIS — G894 Chronic pain syndrome: Secondary | ICD-10-CM

## 2014-03-30 MED ORDER — OXYCODONE HCL 5 MG PO TABS: 5.0000 mg | ORAL_TABLET | Freq: Three times a day (TID) | ORAL | Status: DC | PRN

## 2014-03-30 NOTE — Telephone Encounter (Signed)
Caller name:Reardon [place] Relation to WU:JWJXpt:self Call back number:[phone] Pharmacy:  Reason for call: pt is needing rx   oxyCODONE (OXY IR/ROXICODONE) 5 MG immediate release tablet    Please call when available for pick up

## 2014-03-30 NOTE — Telephone Encounter (Signed)
Med filled.  

## 2014-03-30 NOTE — Telephone Encounter (Signed)
Last OV 11-08-13 Oxycodone last filled 02-01-14 #90 with 0

## 2014-03-30 NOTE — Telephone Encounter (Signed)
Ok for 90

## 2014-04-15 ENCOUNTER — Ambulatory Visit: Payer: Medicare Other | Admitting: Internal Medicine

## 2014-04-15 ENCOUNTER — Ambulatory Visit: Payer: Medicare Other | Admitting: Medical

## 2014-06-08 ENCOUNTER — Telehealth: Payer: Self-pay | Admitting: Family Medicine

## 2014-06-08 DIAGNOSIS — G894 Chronic pain syndrome: Secondary | ICD-10-CM

## 2014-06-08 MED ORDER — OXYCODONE HCL 5 MG PO TABS: 5.0000 mg | ORAL_TABLET | Freq: Three times a day (TID) | ORAL | Status: DC | PRN

## 2014-06-08 NOTE — Telephone Encounter (Signed)
Caller name: Merolla, [place]Jordan M Relation to pt: self  Call back number: [phone]   Reason for call:  Pt requesting a refill oxyCODONE (OXY IR/ROXICODONE) 5 MG immediate release tab

## 2014-06-08 NOTE — Telephone Encounter (Signed)
Ok for 90

## 2014-06-08 NOTE — Telephone Encounter (Signed)
Last OV 10/2013 and advised f/u in 1 yr.  Last Oxy Rx 03/30/14, #90. Last controlled substance contract 2014. Rx pended.

## 2014-06-08 NOTE — Telephone Encounter (Signed)
Med filled and pt notified.  

## 2014-08-17 ENCOUNTER — Telehealth: Payer: Self-pay | Admitting: Family Medicine

## 2014-08-17 DIAGNOSIS — G894 Chronic pain syndrome: Secondary | ICD-10-CM

## 2014-08-17 MED ORDER — OXYCODONE HCL 5 MG PO TABS: 5.0000 mg | ORAL_TABLET | Freq: Three times a day (TID) | ORAL | Status: DC | PRN

## 2014-08-17 NOTE — Telephone Encounter (Signed)
Ok for #90- may be due for UDS (please confirm this)

## 2014-08-17 NOTE — Telephone Encounter (Signed)
Last OV 11-08-13 (CPE 11-10-14) Oxycodone last filled 06/08/14 #90 with 0

## 2014-08-17 NOTE — Telephone Encounter (Signed)
Med filled, pt notified available at front desk for pick up. UDS is due.

## 2014-08-17 NOTE — Telephone Encounter (Signed)
Caller name: Swazil[place]jordan Relation to pt: self Call back number: [phone] Pharmacy:  Reason for call:   Requesting oxycodone refill

## 2014-08-22 DIAGNOSIS — Z79891 Long term (current) use of opiate analgesic: Secondary | ICD-10-CM | POA: Diagnosis not present

## 2014-10-18 ENCOUNTER — Telehealth: Payer: Self-pay | Admitting: Family Medicine

## 2014-10-18 DIAGNOSIS — G894 Chronic pain syndrome: Secondary | ICD-10-CM

## 2014-10-18 NOTE — Telephone Encounter (Signed)
Reason for call: Pt called for refill on oxycodone. He is taking 1 1/2 per day. He said he has 3 doses left.  Please call pt when ready for pick up. Filled 08/17/14, #90, 1 tablet Q8H prn

## 2014-10-19 MED ORDER — OXYCODONE HCL 5 MG PO TABS: 5.0000 mg | ORAL_TABLET | Freq: Three times a day (TID) | ORAL | Status: DC | PRN

## 2014-10-19 NOTE — Telephone Encounter (Signed)
Last OV 11/08/13 (CPE scheduled for 11/10/14) Oxycodone last filled 08/17/14 #90 with 0  CSC of file, and UDs up to date

## 2014-10-19 NOTE — Telephone Encounter (Signed)
Med filled, pt informed available at front desk for pick up.  

## 2014-10-19 NOTE — Telephone Encounter (Signed)
Ok for 90

## 2014-10-26 ENCOUNTER — Telehealth: Payer: Self-pay | Admitting: Family Medicine

## 2014-10-26 NOTE — Telephone Encounter (Signed)
pre visit letter mailed 10/20/14 °

## 2014-11-09 ENCOUNTER — Telehealth: Payer: Self-pay

## 2014-11-09 NOTE — Telephone Encounter (Signed)
Pre visit call completed 

## 2014-11-10 ENCOUNTER — Encounter: Payer: Self-pay | Admitting: Family Medicine

## 2014-11-10 ENCOUNTER — Ambulatory Visit (INDEPENDENT_AMBULATORY_CARE_PROVIDER_SITE_OTHER): Payer: Medicare Other | Admitting: Family Medicine

## 2014-11-10 VITALS — BP 120/72 | HR 59 | Temp 98.0°F | Resp 16 | Ht 71.25 in | Wt 191.8 lb

## 2014-11-10 DIAGNOSIS — J312 Chronic pharyngitis: Secondary | ICD-10-CM

## 2014-11-10 DIAGNOSIS — E663 Overweight: Secondary | ICD-10-CM | POA: Diagnosis not present

## 2014-11-10 DIAGNOSIS — Z0000 Encounter for general adult medical examination without abnormal findings: Secondary | ICD-10-CM | POA: Diagnosis not present

## 2014-11-10 LAB — CBC WITH DIFFERENTIAL/PLATELET
BASOS ABS: 0 10*3/uL (ref 0.0–0.1)
Basophils Relative: 0.6 % (ref 0.0–3.0)
EOS PCT: 0.9 % (ref 0.0–5.0)
Eosinophils Absolute: 0.1 10*3/uL (ref 0.0–0.7)
HCT: 46.2 % (ref 39.0–52.0)
Hemoglobin: 15.6 g/dL (ref 13.0–17.0)
LYMPHS ABS: 1.9 10*3/uL (ref 0.7–4.0)
Lymphocytes Relative: 32.9 % (ref 12.0–46.0)
MCHC: 33.8 g/dL (ref 30.0–36.0)
MCV: 86.9 fl (ref 78.0–100.0)
MONOS PCT: 8.3 % (ref 3.0–12.0)
Monocytes Absolute: 0.5 10*3/uL (ref 0.1–1.0)
Neutro Abs: 3.2 10*3/uL (ref 1.4–7.7)
Neutrophils Relative %: 57.3 % (ref 43.0–77.0)
PLATELETS: 190 10*3/uL (ref 150.0–400.0)
RBC: 5.31 Mil/uL (ref 4.22–5.81)
RDW: 13.3 % (ref 11.5–15.5)
WBC: 5.7 10*3/uL (ref 4.0–10.5)

## 2014-11-10 LAB — LIPID PANEL
Cholesterol: 240 mg/dL — ABNORMAL HIGH (ref 0–200)
HDL: 76.8 mg/dL (ref >39.00)
LDL Cholesterol: 151 mg/dL — ABNORMAL HIGH (ref 0–99)
NONHDL: 163.11
Total CHOL/HDL Ratio: 3
Triglycerides: 63 mg/dL (ref 0.0–149.0)
VLDL: 12.6 mg/dL (ref 0.0–40.0)

## 2014-11-10 LAB — BASIC METABOLIC PANEL
BUN: 12 mg/dL (ref 6–23)
CALCIUM: 10 mg/dL (ref 8.4–10.5)
CHLORIDE: 101 meq/L (ref 96–112)
CO2: 31 meq/L (ref 19–32)
Creatinine, Ser: 0.83 mg/dL (ref 0.40–1.50)
GFR: 141.64 mL/min (ref >60.00)
Glucose, Bld: 84 mg/dL (ref 70–99)
Potassium: 4.1 mEq/L (ref 3.5–5.1)
Sodium: 139 mEq/L (ref 135–145)

## 2014-11-10 LAB — HEPATIC FUNCTION PANEL
ALK PHOS: 57 U/L (ref 39–117)
ALT: 17 U/L (ref 0–53)
AST: 22 U/L (ref 0–37)
Albumin: 4.6 g/dL (ref 3.5–5.2)
BILIRUBIN DIRECT: 0.1 mg/dL (ref 0.0–0.3)
TOTAL PROTEIN: 7.4 g/dL (ref 6.0–8.3)
Total Bilirubin: 0.8 mg/dL (ref 0.2–1.2)

## 2014-11-10 NOTE — Patient Instructions (Signed)
Follow up in 1 year or as needed We'll notify you of your lab results and make any changes if needed Keep up the good work on healthy diet and regular exercise Start OTC Zyrtec daily- if the sore throat continues after 1 month of use, let me know and we'll refer to ENT Call your insurance and ask about the tetanus shot Call with any questions or concerns Happy Labor Day!!!

## 2014-11-10 NOTE — Progress Notes (Signed)
Pre visit review using our clinic review tool, if applicable. No additional management support is needed unless otherwise documented below in the visit note. 

## 2014-11-10 NOTE — Progress Notes (Signed)
   Subjective:    Patient ID: Douglas Nelson, male    DOB: 01/19/1986, 29 y.o.   MRN: [id number]  HPI Here today for CPE.  Risk Factors: Overweight- pt has lost almost 15 lbs since last visit.  Exercising regularly. Physical Activity: exercising regularly Fall Risk: no recent falls, ambulates w/ cane due to chronic foot pain Depression: denies Hearing: normal to conversational tones and whispered voice at 6 ft ADL's: independent Cognitive: normal linear thought process, memory and attention intact Home Safety: safe at home Height, Weight, BMI, Visual Acuity: see vitals, vision corrected to 20/20 w/ glasses Counseling: too young for colonoscopy, declines flu shot.  Interested in Tdap but this is not covered by Medicare. Care team reviewed and updated w/ pt Labs Ordered: See A&P Care Plan: See A&P    Review of Systems Patient reports no vision/hearing changes, anorexia, fever ,adenopathy, persistant/recurrent hoarseness, swallowing issues, chest pain, palpitations, edema, persistant/recurrent cough, hemoptysis, dyspnea (rest,exertional, paroxysmal nocturnal), gastrointestinal  bleeding (melena, rectal bleeding), abdominal pain, excessive heart burn, GU symptoms (dysuria, hematuria, voiding/incontinence issues) syncope, focal weakness, memory loss, numbness & tingling, skin/hair/nail changes, depression, anxiety, abnormal bruising/bleeding, musculoskeletal symptoms/signs.   + sore throat- worse w/ talking.  sxs started 5 yrs ago but recently worsening.    Objective:   Physical Exam General Appearance:    Alert, cooperative, no distress, appears stated age  Head:    Normocephalic, without obvious abnormality, atraumatic  Eyes:    PERRL, conjunctiva/corneas clear, EOM's intact, fundi    benign, both eyes       Ears:    Normal TM's and external ear canals, both ears  Nose:   Nares normal, septum midline, mucosa normal, no drainage   or sinus tenderness  Throat:   Lips, mucosa, and tongue  normal; teeth and gums normal  Neck:   Supple, symmetrical, trachea midline, no adenopathy;       thyroid:  No enlargement/tenderness/nodules  Back:     Symmetric, no curvature, ROM normal, no CVA tenderness  Lungs:     Clear to auscultation bilaterally, respirations unlabored  Chest wall:    No tenderness or deformity  Heart:    Regular rate and rhythm, S1 and S2 normal, no murmur, rub   or gallop  Abdomen:     Soft, non-tender, bowel sounds active all four quadrants,    no masses, no organomegaly  Genitalia:    Normal male without lesion, masses,discharge or tenderness  Rectal:    Deferred due to young age  Extremities:   Extremities normal, atraumatic, no cyanosis or edema  Pulses:   2+ and symmetric all extremities  Skin:   Skin color, texture, turgor normal, no rashes or lesions  Lymph nodes:   Cervical, supraclavicular, and axillary nodes normal  Neurologic:   CNII-XII intact. Normal strength, sensation and reflexes      throughout          Assessment & Plan:

## 2014-11-15 NOTE — Assessment & Plan Note (Signed)
Pt's PE WNL.  Due for Tdap but pt needs to check w/ insurance prior to injxn to make sure this is covered.  Check labs.  Anticipatory guidance provided.

## 2014-11-15 NOTE — Assessment & Plan Note (Signed)
Ongoing issue for pt.  He has lost quite a bit of weight since last year.  Applauded his efforts on healthy diet and regular exercise.  Check labs to risk stratify.  Will follow.

## 2014-12-23 ENCOUNTER — Telehealth: Payer: Self-pay | Admitting: Family Medicine

## 2014-12-23 DIAGNOSIS — G894 Chronic pain syndrome: Secondary | ICD-10-CM

## 2014-12-23 MED ORDER — OXYCODONE HCL 5 MG PO TABS: 5.0000 mg | ORAL_TABLET | Freq: Three times a day (TID) | ORAL | Status: DC | PRN

## 2014-12-23 NOTE — Telephone Encounter (Signed)
Last OV 11/10/14 Oxycodone last filled 10/19/14 #90 with 0

## 2014-12-23 NOTE — Telephone Encounter (Signed)
Med filled and pt informed.  

## 2014-12-23 NOTE — Telephone Encounter (Signed)
Ok for 90

## 2014-12-23 NOTE — Telephone Encounter (Signed)
Relation to XB:JYNWpt:self Call back number: [phone]  Pharm[facility]: [id number] - [place], [place] - [address] [facility] [address] [phone] (Fax)        Reason for call:  Patient requesting a refill oxyCODONE (OXY IR/ROXICODONE) 5 MG immediate release tablet, patient requesting a refill zyrtec and stated he would like to discuss medication

## 2015-01-25 ENCOUNTER — Ambulatory Visit (HOSPITAL_BASED_OUTPATIENT_CLINIC_OR_DEPARTMENT_OTHER)
Admission: RE | Admit: 2015-01-25 | Discharge: 2015-01-25 | Disposition: A | Discharge status: Home / Self Care | Payer: Medicare Other | Source: Ambulatory Visit | Attending: Physician Assistant | Admitting: Physician Assistant

## 2015-01-25 ENCOUNTER — Ambulatory Visit (INDEPENDENT_AMBULATORY_CARE_PROVIDER_SITE_OTHER): Payer: Medicare Other | Admitting: Physician Assistant

## 2015-01-25 ENCOUNTER — Encounter: Payer: Self-pay | Admitting: Physician Assistant

## 2015-01-25 VITALS — BP 113/58 | HR 62 | Temp 98.2°F | Resp 16 | Ht 71.0 in | Wt 200.2 lb

## 2015-01-25 DIAGNOSIS — R319 Hematuria, unspecified: Secondary | ICD-10-CM | POA: Insufficient documentation

## 2015-01-25 LAB — POCT URINALYSIS DIPSTICK
BILIRUBIN UA: NEGATIVE
GLUCOSE UA: NEGATIVE
Ketones, UA: NEGATIVE
Leukocytes, UA: NEGATIVE
Nitrite, UA: NEGATIVE
Protein, UA: NEGATIVE
SPEC GRAV UA: 1.015
Urobilinogen, UA: 0.2
pH, UA: 6

## 2015-01-25 NOTE — Progress Notes (Signed)
Patient presents to clinic today c/o an episode of painless hematuria this morning. Denies urinary urgency, frequency, dysuria, flank pain, nausea or vomiting. Denies history of nephrolithiasis. Endorses working out intensely Sunday and yesterday.  Past Medical History  Diagnosis Date  . Chronic foot pain     Current Outpatient Prescriptions on File Prior to Visit  Medication Sig Dispense Refill  . b complex vitamins capsule Take 1 capsule by mouth daily.    . Multiple Vitamin (MULTIVITAMIN) tablet Take 1 tablet by mouth daily.    Marland Kitchen oxyCODONE (OXY IR/ROXICODONE) 5 MG immediate release tablet Take 1 tablet (5 mg total) by mouth every 8 (eight) hours as needed. 90 tablet 0  . Probiotic Product (PROBIOTIC ADVANCED PO) Take by mouth.    . zinc gluconate 50 MG tablet Take 50 mg by mouth daily.     No current facility-administered medications on file prior to visit.    No Known Allergies  Family History  Problem Relation Age of Onset  . Anuerysm Father   . Hypertension Father   . Hypertension Maternal Grandmother   . Hypertension Maternal Grandfather   . Cancer Mother     Ovarian    Social History   Social History  . Marital Status: Single    Spouse Name: N/A  . Number of Children: N/A  . Years of Education: N/A   Social History Main Topics  . Smoking status: Former Smoker    Types: Cigarettes    Quit date: 08/30/2010  . Smokeless tobacco: None  . Alcohol Use: None     Comment: occasionally  . Drug Use: No  . Sexual Activity: Not Asked   Other Topics Concern  . None   Social History Narrative   Review of Systems - See HPI.  All other ROS are negative.  BP 113/58 mmHg  Pulse 62  Temp(Src) 98.2 F (36.8 C) (Oral)  Resp 16  Ht  (1.803 m)  Wt 200 lb 4 oz (90.833 kg)  BMI 27.94 kg/m2  SpO2 100%  Physical Exam  Constitutional: He is well-developed, well-nourished, and in no distress.  HENT:  Head: Normocephalic and atraumatic.  Cardiovascular: Normal  rate, regular rhythm, normal heart sounds and intact distal pulses.   Pulmonary/Chest: Effort normal and breath sounds normal. No respiratory distress. He has no wheezes. He has no rales. He exhibits no tenderness.  Abdominal: Soft. Bowel sounds are normal. He exhibits no distension and no mass. There is no tenderness. There is no rebound and no guarding.  Neurological: He is alert.  Skin: Skin is warm and dry. No rash noted.  Vitals reviewed.   Recent Results (from the past 2160 hour(s))  Lipid panel     Status: Abnormal   Collection Time: 11/10/14 10:52 AM  Result Value Ref Range   Cholesterol 240 (H) 0 - 200 mg/dL    Comment: ATP III Classification       Desirable:  < 200 mg/dL               Borderline High:  200 - 239 mg/dL          High:  > = 161 mg/dL   Triglycerides 09.6 0.0 - 149.0 mg/dL    Comment: Normal:  <045 mg/dLBorderline High:  150 - 199 mg/dL   HDL 40.98 >11.91 mg/dL   VLDL 47.8 0.0 - 29.5 mg/dL   LDL Cholesterol 621 (H) 0 - 99 mg/dL   Total CHOL/HDL Ratio 3  Comment:                Men          Women1/2 Average Risk     3.4          3.3Average Risk          5.0          4.42X Average Risk          9.6          7.13X Average Risk          15.0          11.0                       NonHDL 163.11     Comment: NOTE:  Non-HDL goal should be 30 mg/dL higher than patient's LDL goal (i.e. LDL goal of < 70 mg/dL, would have non-HDL goal of < 100 mg/dL)  Basic metabolic panel     Status: None   Collection Time: 11/10/14 10:52 AM  Result Value Ref Range   Sodium 139 135 - 145 mEq/L   Potassium 4.1 3.5 - 5.1 mEq/L   Chloride 101 96 - 112 mEq/L   CO2 31 19 - 32 mEq/L   Glucose, Bld 84 70 - 99 mg/dL   BUN 12 6 - 23 mg/dL   Creatinine, Ser 1.610.83 0.40 - 1.50 mg/dL   Calcium 09.610.0 8.4 - 04.510.5 mg/dL   GFR 409.81141.64 >19.14>60.00 mL/min  Hepatic function panel     Status: None   Collection Time: 11/10/14 10:52 AM  Result Value Ref Range   Total Bilirubin 0.8 0.2 - 1.2 mg/dL   Bilirubin,  Direct 0.1 0.0 - 0.3 mg/dL   Alkaline Phosphatase 57 39 - 117 U/L   AST 22 0 - 37 U/L   ALT 17 0 - 53 U/L   Total Protein 7.4 6.0 - 8.3 g/dL   Albumin 4.6 3.5 - 5.2 g/dL  CBC with Differential/Platelet     Status: None   Collection Time: 11/10/14 10:52 AM  Result Value Ref Range   WBC 5.7 4.0 - 10.5 K/uL   RBC 5.31 4.22 - 5.81 Mil/uL   Hemoglobin 15.6 13.0 - 17.0 g/dL   HCT 78.246.2 95.639.0 - 21.352.0 %   MCV 86.9 78.0 - 100.0 fl   MCHC 33.8 30.0 - 36.0 g/dL   RDW 08.613.3 57.811.5 - 46.915.5 %   Platelets 190.0 150.0 - 400.0 K/uL   Neutrophils Relative % 57.3 43.0 - 77.0 %   Lymphocytes Relative 32.9 12.0 - 46.0 %   Monocytes Relative 8.3 3.0 - 12.0 %   Eosinophils Relative 0.9 0.0 - 5.0 %   Basophils Relative 0.6 0.0 - 3.0 %   Neutro Abs 3.2 1.4 - 7.7 K/uL   Lymphs Abs 1.9 0.7 - 4.0 K/uL   Monocytes Absolute 0.5 0.1 - 1.0 K/uL   Eosinophils Absolute 0.1 0.0 - 0.7 K/uL   Basophils Absolute 0.0 0.0 - 0.1 K/uL    Assessment/Plan: Hematuria Today only. No pain or UTI symptoms. History of heavy exercise. Urine dip + blood. No sign of UTI. Will send for micro and culture. Will obtain KUB today to assess for stone. Increase fluids. Will treat based on findings and continue to monitor.

## 2015-01-25 NOTE — Patient Instructions (Signed)
Please go downstairs for an x-ray. I will call you with your results. Stay well hydrated and avoid heavy lifting or overexertion for a couple of days. Symptoms are most likely coming from exercise or a small stone. We will treat based on findings.

## 2015-01-25 NOTE — Assessment & Plan Note (Signed)
Today only. No pain or UTI symptoms. History of heavy exercise. Urine dip + blood. No sign of UTI. Will send for micro and culture. Will obtain KUB today to assess for stone. Increase fluids. Will treat based on findings and continue to monitor.

## 2015-01-26 ENCOUNTER — Telehealth: Payer: Self-pay

## 2015-01-26 LAB — CULTURE, URINE COMPREHENSIVE
Colony Count: NO GROWTH
Organism ID, Bacteria: NO GROWTH

## 2015-01-26 LAB — URINALYSIS, MICROSCOPIC ONLY

## 2015-01-26 NOTE — Telephone Encounter (Signed)
Called patient with lab results. Patient states he hasn't noticed any hematuria since yesterday.Only has slight discoloration usually drinlks 15 cups of water per day with tea at times. Advised he would get call back with urine results.

## 2015-01-30 NOTE — Telephone Encounter (Signed)
Pre visit call completed 

## 2015-03-02 ENCOUNTER — Telehealth: Payer: Self-pay | Admitting: Family Medicine

## 2015-03-02 MED ORDER — CETIRIZINE HCL 10 MG PO TABS: 10.0000 mg | ORAL_TABLET | Freq: Every day | ORAL | Status: AC

## 2015-03-02 NOTE — Telephone Encounter (Signed)
Ok for Zyrtec 10mg , 1 tab daily, #90, 1 refill

## 2015-03-02 NOTE — Telephone Encounter (Signed)
Zyrtec sent in to pharmacy. Called the patient informed script sent in.

## 2015-03-02 NOTE — Telephone Encounter (Signed)
Relation to ZO:XWRUpt:self Call back [phone] Pharmacy: [facility] [id number] - [place], [place] - [address] [facility] [phone])         Reason for call:  Patient states at last office visit 11/10/2014 prescribing zyrtec was discussed (med list does not reflect) patient requesting 3 month supply. Please advise

## 2015-03-10 ENCOUNTER — Telehealth: Payer: Self-pay | Admitting: Family Medicine

## 2015-03-10 DIAGNOSIS — G894 Chronic pain syndrome: Secondary | ICD-10-CM

## 2015-03-10 MED ORDER — OXYCODONE HCL 5 MG PO TABS: 5.0000 mg | ORAL_TABLET | Freq: Three times a day (TID) | ORAL | Status: DC | PRN

## 2015-03-10 NOTE — Telephone Encounter (Signed)
Prescription routed to Dr. Laury AxonLowne since PCP is not in the office this afternoon.   Last OV 11/10/14 (CPE) Oxycodone last filled 12/23/14 #90 with 0  CSC on file, Low Risk, UTD with UDS

## 2015-03-10 NOTE — Telephone Encounter (Signed)
Relation to ZO:XWRUpt:self Call back number:[phone]   Reason for call:  Patient requesting a refill  oxyCODONE (OXY IR/ROXICODONE) 5 MG immediate release tablet

## 2015-03-10 NOTE — Telephone Encounter (Signed)
Med filled and pt informed available at front desk for pick up.  

## 2015-03-10 NOTE — Telephone Encounter (Signed)
Refill x1 

## 2015-05-25 ENCOUNTER — Telehealth: Payer: Self-pay | Admitting: Family Medicine

## 2015-05-25 DIAGNOSIS — G894 Chronic pain syndrome: Secondary | ICD-10-CM

## 2015-05-25 MED ORDER — OXYCODONE HCL 5 MG PO TABS: 5.0000 mg | ORAL_TABLET | Freq: Three times a day (TID) | ORAL | Status: DC | PRN

## 2015-05-25 NOTE — Telephone Encounter (Signed)
Last OV 11/10/14 Oxycodone last filled 02/28/15 #90 with 0

## 2015-05-25 NOTE — Telephone Encounter (Signed)
Relation to pt:self °Call back number:[phone] ° ° °Reason for call:  °Patient requesting a refill  oxyCODONE (OXY IR/ROXICODONE) 5 MG immediate release tablet  °

## 2015-05-25 NOTE — Telephone Encounter (Signed)
Ok for 90

## 2015-05-25 NOTE — Telephone Encounter (Signed)
Med filled and pt advised available at the front desk for pick up.

## 2015-08-18 ENCOUNTER — Other Ambulatory Visit: Payer: Self-pay | Admitting: Family Medicine

## 2015-08-18 DIAGNOSIS — G894 Chronic pain syndrome: Secondary | ICD-10-CM

## 2015-08-18 MED ORDER — OXYCODONE HCL 5 MG PO TABS: 5.0000 mg | ORAL_TABLET | Freq: Three times a day (TID) | ORAL | Status: DC | PRN

## 2015-08-18 NOTE — Telephone Encounter (Signed)
Last OV 9/116 Oxycodone last filled 05/25/15 #90 with 0

## 2015-08-18 NOTE — Telephone Encounter (Addendum)
Relation to ZO:XWRUpt:self Call back number:[phone]   Reason for call:  Patient requesting oxyCODONE (OXY IR/ROXICODONE) 5 MG immediate release tablet. [facility]. Please advise

## 2015-08-18 NOTE — Telephone Encounter (Signed)
Pt informed that Rx could be sent to [facility] but it may take until Wednesday to receive. Pt will try to come Monday at lunch to pick up.

## 2015-10-18 ENCOUNTER — Telehealth: Payer: Self-pay | Admitting: Emergency Medicine

## 2015-10-18 DIAGNOSIS — G894 Chronic pain syndrome: Secondary | ICD-10-CM

## 2015-10-18 MED ORDER — OXYCODONE HCL 5 MG PO TABS: 5.0000 mg | ORAL_TABLET | Freq: Three times a day (TID) | ORAL | 0 refills | Status: DC | PRN

## 2015-10-18 NOTE — Telephone Encounter (Signed)
Patient calling requesting refill of Oxycodone 5 mg. Patient would like to [facility] if possible Last rx 08/18/15 #90 0RF Last OV 11/10/14 CPE Please advise

## 2015-10-18 NOTE — Telephone Encounter (Signed)
Ok for #90, no refills 

## 2015-10-18 NOTE — Telephone Encounter (Signed)
Med filled and sent to [facility] office for pick up.

## 2015-11-14 DIAGNOSIS — M79673 Pain in unspecified foot: Secondary | ICD-10-CM

## 2015-11-15 ENCOUNTER — Encounter: Payer: Medicare Other | Admitting: Family Medicine

## 2015-11-22 ENCOUNTER — Encounter: Payer: Self-pay | Admitting: Family Medicine

## 2015-11-22 ENCOUNTER — Ambulatory Visit (INDEPENDENT_AMBULATORY_CARE_PROVIDER_SITE_OTHER): Payer: Medicare HMO | Admitting: Family Medicine

## 2015-11-22 VITALS — BP 140/77 | HR 58 | Temp 98.6°F | Ht 71.0 in | Wt 191.0 lb

## 2015-11-22 DIAGNOSIS — Z130 Encounter for screening for diseases of the blood and blood-forming organs and certain disorders involving the immune mechanism: Secondary | ICD-10-CM | POA: Diagnosis not present

## 2015-11-22 DIAGNOSIS — Z23 Encounter for immunization: Secondary | ICD-10-CM

## 2015-11-22 DIAGNOSIS — B360 Pityriasis versicolor: Secondary | ICD-10-CM

## 2015-11-22 DIAGNOSIS — R0982 Postnasal drip: Secondary | ICD-10-CM

## 2015-11-22 DIAGNOSIS — Z0000 Encounter for general adult medical examination without abnormal findings: Secondary | ICD-10-CM

## 2015-11-22 DIAGNOSIS — Z131 Encounter for screening for diabetes mellitus: Secondary | ICD-10-CM | POA: Diagnosis not present

## 2015-11-22 DIAGNOSIS — Z13220 Encounter for screening for lipoid disorders: Secondary | ICD-10-CM

## 2015-11-22 LAB — CBC
HEMATOCRIT: 44.5 % (ref 39.0–52.0)
Hemoglobin: 15.3 g/dL (ref 13.0–17.0)
MCHC: 34.2 g/dL (ref 30.0–36.0)
MCV: 86.4 fl (ref 78.0–100.0)
PLATELETS: 161 10*3/uL (ref 150.0–400.0)
RBC: 5.16 Mil/uL (ref 4.22–5.81)
RDW: 12.5 % (ref 11.5–15.5)
WBC: 4.3 10*3/uL (ref 4.0–10.5)

## 2015-11-22 LAB — COMPREHENSIVE METABOLIC PANEL
ALBUMIN: 4.6 g/dL (ref 3.5–5.2)
ALK PHOS: 64 U/L (ref 39–117)
ALT: 16 U/L (ref 0–53)
AST: 27 U/L (ref 0–37)
BILIRUBIN TOTAL: 1.2 mg/dL (ref 0.2–1.2)
BUN: 8 mg/dL (ref 6–23)
CALCIUM: 9.8 mg/dL (ref 8.4–10.5)
CO2: 32 meq/L (ref 19–32)
Chloride: 101 mEq/L (ref 96–112)
Creatinine, Ser: 0.83 mg/dL (ref 0.40–1.50)
GFR: 140.61 mL/min (ref >60.00)
Glucose, Bld: 82 mg/dL (ref 70–99)
Potassium: 4.1 mEq/L (ref 3.5–5.1)
Sodium: 140 mEq/L (ref 135–145)
TOTAL PROTEIN: 7.4 g/dL (ref 6.0–8.3)

## 2015-11-22 LAB — LIPID PANEL
CHOLESTEROL: 216 mg/dL — AB (ref 0–200)
HDL: 69 mg/dL (ref >39.00)
LDL Cholesterol: 130 mg/dL — ABNORMAL HIGH (ref 0–99)
NonHDL: 147.34
TRIGLYCERIDES: 86 mg/dL (ref 0.0–149.0)
Total CHOL/HDL Ratio: 3
VLDL: 17.2 mg/dL (ref 0.0–40.0)

## 2015-11-22 MED ORDER — FLUCONAZOLE 150 MG PO TABS: ORAL_TABLET | ORAL | 0 refills | Status: DC

## 2015-11-22 MED ORDER — IPRATROPIUM BROMIDE 0.03 % NA SOLN: 2.0000 | Freq: Four times a day (QID) | NASAL | 6 refills | Status: DC

## 2015-11-22 NOTE — Patient Instructions (Signed)
I will be in touch with your labs, and you got your tetanus shot (good for 10 years) today For the tinea versicolor, take 2 diflucan pills once and repeat in 7 days, you can also use Selsun Blue shampoo on your skin Try the atrovent nasal spray up to 4x a day as needed for post nasal drip or runny nose  We can plan to recheck in one year, sooner if you need anything!

## 2015-11-22 NOTE — Progress Notes (Signed)
[facility] [address] [place], [place] [address] [phone]  Date:  11/22/2015   Name:  Douglas Nelson   DOB:  Mar 22, 1985   MRN:  [id number]  PCP:  Neena Rhymes, MD    Chief Complaint: Follow-up (Pt here for 1 year follow up. Former pt of Dr. Beverely Low. Pt is fasting for labs if needed. )   History of Present Illness:  Douglas M Case is a 30 y.o. very pleasant male patient who presents with the following:  Here today for CPE He is overall doing well today He has had left foot surgery a couple of times; the ankle is fused. He does have to use a cane and his exercise is somewhat limited due to this problem.  However he does his best to stay active He is fasting today for labs  He is taking claritin or zyrtec for his allergies.  He will also notice some PND- his throat will feel inflamed sometimes.  This is improved with the antihistamines but not resolved He has 3 children- 13, 27 and 82 yo. He is married.  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 He has noted this again over the last 6+ months and wonders if there is any other treatment for it.  It is unsightly but otherwise causes no sx    Patient Active Problem List   Diagnosis Date Noted  . Hematuria 01/25/2015  . Sore throat, chronic 11/10/2014  . Routine general medical examination at a health care facility 11/08/2013  . Overweight (BMI 25.0-29.9) 11/08/2013  . Acute sinus infection 08/31/2012  . Possible exposure to STD 06/10/2012  . Encounter for male birth control 06/10/2012  . Polydipsia 06/14/2011  . Allergic rhinitis, seasonal 06/14/2011  . URI (upper respiratory infection) 04/04/2011  .  Exposure to the flu 04/04/2011  . S/P foot surgery 04/04/2011    Past Medical History:  Diagnosis Date  . Chronic foot pain     Past Surgical History:  Procedure Laterality Date  . SUBTALAR DISTRACTION BONE BLOCK FUSION WITH ILIAC CREST BONE GRAFT Left     Social History  Substance Use Topics  . Smoking status: Former Smoker    Types: Cigarettes    Quit date: 08/30/2010  . Smokeless tobacco: Not on file  . Alcohol use Not on file     Comment: occasionally    Family History  Problem Relation Age of Onset  . Anuerysm Father   . Hypertension Father   . Hypertension Maternal Grandmother   . Hypertension Maternal Grandfather   . Cancer Mother     Ovarian    No Known Allergies  Medication list has been reviewed and updated.  Current Outpatient Prescriptions on File Prior to Visit  Medication Sig Dispense Refill  . b complex vitamins capsule Take 1 capsule by mouth daily.    . cetirizine (ZYRTEC) 10 MG tablet Take 1 tablet (10 mg total) by mouth daily. 90 tablet 1  . Multiple Vitamin (MULTIVITAMIN) tablet Take 1 tablet by mouth daily.    Marland Kitchen oxyCODONE (OXY IR/ROXICODONE) 5 MG immediate release tablet Take 1 tablet (5  mg total) by mouth every 8 (eight) hours as needed. 90 tablet 0  . Probiotic Product (PROBIOTIC ADVANCED PO) Take by mouth.    . zinc gluconate 50 MG tablet Take 50 mg by mouth daily.     No current facility-administered medications on file prior to visit.     Review of Systems:  As per HPI- otherwise negative. No fever, chills, CP, SOB, nausea or vomiting   Physical Examination: Vitals:   11/22/15 0949  BP: 140/77  Pulse: (!) 58  Temp: 98.6 F (37 C)   Vitals:   11/22/15 0949  Weight: 191 lb (86.6 kg)  Height: 5\' 11"  (1.803 m)   Body mass index is 26.64 kg/m. Ideal Body Weight: Weight in (lb) to have BMI = 25: 178.9  GEN: WDWN, NAD, Non-toxic, A & O x 3, looks well and healthy  HEENT: Atraumatic, Normocephalic. Neck supple. No masses, No  LAD.  Bilateral TM wnl, oropharynx normal.  PEERL,EOMI.   Ears and Nose: No external deformity. CV: RRR, No M/G/R. No JVD. No thrill. No extra heart sounds. PULM: CTA B, no wheezes, crackles, rhonchi. No retractions. No resp. distress. No accessory muscle use. ABD: S, NT, ND, +BS. No rebound. No HSM. EXTR: No c/c/e NEURO Normal gait for pt- uses a cane and does have left ankle and foot problems as above PSYCH: Normally interactive. Conversant. Not depressed or anxious appearing.  Calm demeanor.  Tinea versicolor on his chest and back  Assessment and Plan: Routine general medical examination at a health care facility  Post-nasal drip - Plan: ipratropium (ATROVENT) 0.03 % nasal spray  Screening for diabetes mellitus - Plan: Comprehensive metabolic panel  Screening for hyperlipidemia - Plan: Lipid panel  Screening for deficiency anemia - Plan: CBC  Immunization due - Plan: Tdap vaccine greater than or equal to 7yo IM  Tinea versicolor - Plan: fluconazole (DIFLUCAN) 150 MG tablet  Here today for a CPE, tdap given I will be in touch with your labs, and you got your tetanus shot (good for 10 years) today For the tinea versicolor, take 2 diflucan pills once and repeat in 7 days, you can also use Selsun Blue shampoo on your skin Try the atrovent nasal spray up to 4x a day as needed for post nasal drip or runny nose  We can plan to recheck in one year, sooner if you need anything!  Signed Abbe AmsterdamJessica Thelton Graca, MD

## 2015-12-15 ENCOUNTER — Encounter: Payer: Self-pay | Admitting: Family Medicine

## 2015-12-15 ENCOUNTER — Ambulatory Visit (INDEPENDENT_AMBULATORY_CARE_PROVIDER_SITE_OTHER): Payer: Medicare HMO | Admitting: Family Medicine

## 2015-12-15 ENCOUNTER — Ambulatory Visit: Payer: Medicare HMO | Admitting: Family Medicine

## 2015-12-15 VITALS — BP 110/50 | HR 75 | Temp 98.3°F | Ht 71.0 in | Wt 195.0 lb

## 2015-12-15 DIAGNOSIS — H6993 Unspecified Eustachian tube disorder, bilateral: Secondary | ICD-10-CM

## 2015-12-15 DIAGNOSIS — H6983 Other specified disorders of Eustachian tube, bilateral: Secondary | ICD-10-CM

## 2015-12-15 DIAGNOSIS — J029 Acute pharyngitis, unspecified: Secondary | ICD-10-CM

## 2015-12-15 LAB — POCT RAPID STREP A (OFFICE): Rapid Strep A Screen: NEGATIVE

## 2015-12-15 NOTE — Patient Instructions (Addendum)
Claritin (loratadine), Allegra (fexofenadine), Zyrtec (cetirizine); these are listed in order from weakest to strongest. Generic, and therefore cheaper, options are in the parentheses.   Flonase (fluticasone); nasal spray that is over the counter. 2 sprays each nostril, once daily. Aim towards the same side eye when you spray.  There are available OTC, and the generic versions, which may be cheaper, are in parentheses. Show this to a pharmacist if you have trouble finding any of these items.  Ibuprofen 600 mg three times daily for throat pain is shown to be effective.

## 2015-12-15 NOTE — Progress Notes (Signed)
Pre visit review using our clinic review tool, if applicable. No additional management support is needed unless otherwise documented below in the visit note. 

## 2015-12-15 NOTE — Progress Notes (Signed)
SUBJECTIVE:   Douglas Nelson is a 30 y.o. male presents to the clinic for:  Chief Complaint  Patient presents with  . Sore Throat    started on yest,chest congestion    Complains of sore throat for 1 day.  Other associated symptoms: ear pain and chest tightness.  Denies: sinus congestion, rhinorrhea, itchy watery eyes, ear drainage and fevers and cough Sick Contacts: daughter and wife had cough, no strep Therapy to date: vitamins, antihistamine  History  Smoking Status  . Former Smoker  . Types: Cigarettes  . Quit date: 08/30/2010  Smokeless Tobacco  . Not on file    ROS: Pertinent items are noted in HPI  Patient's medications, allergies, past medical, surgical, social and family histories were reviewed and updated as appropriate.  OBJECTIVE:  BP (!) 110/50 (BP Location: Left Arm, Patient Position: Sitting, Cuff Size: Large)   Pulse 75   Temp 98.3 F (36.8 C) (Oral)   Ht 5\' 11"  (1.803 m)   Wt 195 lb (88.5 kg)   SpO2 99%   BMI 27.20 kg/m  General: Awake, alert, appearing stated age Eyes: conjunctivae and sclerae clear Ears: normal TMs bilaterally Nose: no visible exudate Oropharyn+post-nasal drip, lips, mucosa, and tongue normal; teeth and gums normal and +PND Neck: supple, no significant adenopathy Lungs: clear to auscultation, no wheezes, rales or rhonchi, symmetric air entry, normal effort Heart: rate and rhythm regular Skin:reveals no rash Psych: Age appropriate judgment and insight  ASSESSMENT/PLAN:  Viral pharyngitis - Plan: POCT rapid strep A  Dysfunction of both eustachian tubes  Sore throat - Plan: POCT rapid strep A  Orders as above. Neg strep. Pt was very concerened he is in the beginning stages of an illness that may require an abx. I informed him that he has no signs or symptoms of any bacterial illness at this time. Any abx that I would call in would be a waste of money for him and exposure to possible side effects.  He was then more understanding  of my rationale and agreed to take ibuprofen for pain.  F/u prn. Pt voiced understanding and agreement to the plan.  Jilda Rocheicholas Paul WoodlawnWendling, DO 12/15/15 4:17 PM

## 2015-12-21 ENCOUNTER — Other Ambulatory Visit: Payer: Self-pay | Admitting: Family Medicine

## 2015-12-21 ENCOUNTER — Other Ambulatory Visit: Payer: Self-pay | Admitting: Emergency Medicine

## 2015-12-21 DIAGNOSIS — G894 Chronic pain syndrome: Secondary | ICD-10-CM

## 2015-12-21 MED ORDER — OXYCODONE HCL 5 MG PO TABS: 5.0000 mg | ORAL_TABLET | Freq: Three times a day (TID) | ORAL | 0 refills | Status: DC | PRN

## 2015-12-21 NOTE — Telephone Encounter (Signed)
Received refill request for oxyCODONE (OXY IR/ROXICODONE) 5 MG immediate release tablet. Is it ok to refill? Please advise.

## 2015-12-21 NOTE — Telephone Encounter (Signed)
Called pt- he takes this for his chronic foot and ankle pain from his fusion. Will refill for him

## 2015-12-21 NOTE — Telephone Encounter (Signed)
°  Relationship to patient: Self Can be reached: [phone]  Pharmacy:  Reason for call: Refill oxyCODONE (OXY IR/ROXICODONE) 5 MG immediate release tablet

## 2016-02-22 ENCOUNTER — Other Ambulatory Visit: Payer: Self-pay | Admitting: Emergency Medicine

## 2016-02-22 ENCOUNTER — Telehealth: Payer: Self-pay | Admitting: Family Medicine

## 2016-02-22 DIAGNOSIS — G894 Chronic pain syndrome: Secondary | ICD-10-CM

## 2016-02-22 MED ORDER — OXYCODONE HCL 5 MG PO TABS: 5.0000 mg | ORAL_TABLET | Freq: Three times a day (TID) | ORAL | 0 refills | Status: DC | PRN

## 2016-02-22 NOTE — Telephone Encounter (Signed)
Caller name: Relationship to patient: Self Can be reached: [phone]  Pharmacy:  Reason for call: Request refill on oxyCODONE (OXY IR/ROXICODONE) 5 MG immediate release tablet [[id number]

## 2016-02-22 NOTE — Telephone Encounter (Signed)
Received refill for oxyCODONE (OXYIR/ROXICODONE) 5 MG immediate release tablet. Last office visit 11/22/15 and last refill 12/21/15. Is it ok to refill? Please advise.

## 2016-02-22 NOTE — Telephone Encounter (Signed)
Reviewed NCCSR- no concerns.  Will request UDS as he is due

## 2016-02-26 DIAGNOSIS — Z79891 Long term (current) use of opiate analgesic: Secondary | ICD-10-CM | POA: Diagnosis not present

## 2016-03-28 ENCOUNTER — Ambulatory Visit: Payer: Medicare HMO | Admitting: Family Medicine

## 2016-04-01 ENCOUNTER — Ambulatory Visit (INDEPENDENT_AMBULATORY_CARE_PROVIDER_SITE_OTHER): Payer: Medicare HMO | Admitting: Family Medicine

## 2016-04-01 ENCOUNTER — Encounter: Payer: Self-pay | Admitting: Family Medicine

## 2016-04-01 VITALS — BP 108/72 | HR 75 | Temp 98.1°F | Ht 71.0 in | Wt 195.0 lb

## 2016-04-01 DIAGNOSIS — M79672 Pain in left foot: Secondary | ICD-10-CM | POA: Diagnosis not present

## 2016-04-01 DIAGNOSIS — J0110 Acute frontal sinusitis, unspecified: Secondary | ICD-10-CM

## 2016-04-01 DIAGNOSIS — L600 Ingrowing nail: Secondary | ICD-10-CM

## 2016-04-01 DIAGNOSIS — G8929 Other chronic pain: Secondary | ICD-10-CM

## 2016-04-01 MED ORDER — AMOXICILLIN-POT CLAVULANATE 875-125 MG PO TABS: 1.0000 | ORAL_TABLET | Freq: Two times a day (BID) | ORAL | 0 refills | Status: DC

## 2016-04-01 NOTE — Progress Notes (Signed)
[facility] at [facility] [address] [place], [place] [address] [phone]  Date:  04/01/2016   Name:  Douglas Nelson   DOB:  Jul 07, 1985   MRN:  [id number]  PCP:  Abbe Amsterdam, MD    Chief Complaint: ingrown toe nail (poss ingrown toe nail on right foot. pt states the toe is painful and has pus coming out. Also c/o congestion, watery eyes, body aches and sinus pressure. Sx's started this past Saturday. Pt has not had flu vaccine for the past several years. )   History of Present Illness:  Douglas Nelson is a 31 y.o. very pleasant male patient who presents with the following:  He had noted an ingrown toenail on the right foot-right great toe.  Last week he noted that he cut the nail a little short and the corner was going under the skin Hewent to pull out the ingrown corner and some pus came out. It is still painful but improved. He was supposed to see [place] last week but we were closed for snow.  At this point the nail seems to be on the mend but he decided to keep his appt anyway  Also, he has possible sx of a sinus infection His wife and daughter were ill last week with similar sx.  Over the last couple of days he has noted sinus pains, nasal congestion, body aches, no ST, no cough.   He has not noted a fever at home He did take some mucinex and tylenol yesterday- nothing so far today No abd symptoms no nausea, vomiting or diarrhea   He has a history of operative repair of a left ankle problem that does cause him some chronic pain.   He uses a cane for this issue.  However never had any nail resection in the past He does use custom orthotics and could use new ones- would like a referral to sports med Patient Active Problem List   Diagnosis Date Noted  . Hematuria 01/25/2015  . Sore throat, chronic 11/10/2014  . Routine general medical examination at a health care facility 11/08/2013  . Overweight (BMI 25.0-29.9) 11/08/2013  .  Acute sinus infection 08/31/2012  . Possible exposure to STD 06/10/2012  . Encounter for male birth control 06/10/2012  . Polydipsia 06/14/2011  . Allergic rhinitis, seasonal 06/14/2011  . URI (upper respiratory infection) 04/04/2011  . Exposure to the flu 04/04/2011  . S/P foot surgery 04/04/2011    Past Medical History:  Diagnosis Date  . Chronic foot pain     Past Surgical History:  Procedure Laterality Date  . SUBTALAR DISTRACTION BONE BLOCK FUSION WITH ILIAC CREST BONE GRAFT Left     Social History  Substance Use Topics  . Smoking status: Former Smoker    Types: Cigarettes    Quit date: 08/30/2010  . Smokeless tobacco: Never Used  . Alcohol use Not on file     Comment: occasionally    Family History  Problem Relation Age of Onset  . Anuerysm Father   . Hypertension Father   . Hypertension Maternal Grandmother   . Hypertension Maternal Grandfather   . Cancer Mother     Ovarian    No Known Allergies  Medication list has been reviewed and updated.  Current Outpatient Prescriptions on File Prior to Visit  Medication Sig Dispense Refill  . b complex vitamins capsule Take 1 capsule by mouth daily.    . cetirizine (ZYRTEC) 10  MG tablet Take 1 tablet (10 mg total) by mouth daily. (Patient taking differently: Take 10 mg by mouth daily. Pt alternates between Claritin and Zyrtec every 6 months.) 90 tablet 1  . ipratropium (ATROVENT) 0.03 % nasal spray Place 2 sprays into the nose 4 (four) times daily. 30 mL 6  . loratadine (CLARITIN) 10 MG tablet Take 10 mg by mouth daily. Pt alternates between Claritin and Zyrtec every 6 months.    . Multiple Vitamin (MULTIVITAMIN) tablet Take 1 tablet by mouth daily.    Marland Kitchen. oxyCODONE (OXY IR/ROXICODONE) 5 MG immediate release tablet Take 1 tablet (5 mg total) by mouth every 8 (eight) hours as needed. 90 tablet 0  . Probiotic Product (PROBIOTIC ADVANCED PO) Take by mouth.    . zinc gluconate 50 MG tablet Take 50 mg by mouth daily.      No current facility-administered medications on file prior to visit.     Review of Systems:  As per HPI- otherwise negative.   Physical Examination: Vitals:   04/01/16 1305  BP: 108/72  Pulse: 75  Temp: 98.1 F (36.7 C)   Vitals:   04/01/16 1305  Weight: 195 lb (88.5 kg)  Height: 5\' 11"  (1.803 m)   Body mass index is 27.2 kg/m. Ideal Body Weight: Weight in (lb) to have BMI = 25: 178.9  GEN: WDWN, NAD, Non-toxic, A & O x 3, looks well, normal weight HEENT: Atraumatic, Normocephalic. Neck supple. No masses, No LAD.  Bilateral TM wnl, oropharynx normal.  PEERL,EOMI.    Mild inflammation of the nasal cavity Ears and Nose: No external deformity. CV: RRR, No M/G/R. No JVD. No thrill. No extra heart sounds. PULM: CTA B, no wheezes, crackles, rhonchi. No retractions. No resp. distress. No accessory muscle use. EXTR: No c/c/e NEURO Normal gait for pt- stiffness of left ankle, uses a cane PSYCH: Normally interactive. Conversant. Not depressed or anxious appearing.  Calm demeanor.  Right great toe: he has mild tenderness along the medial nail fold, but no redness, pus or heat.  At this time the nail does not appear to be acutely ingrown or infected  Assessment and Plan: Ingrown nail of great toe of right foot - Plan: amoxicillin-clavulanate (AUGMENTIN) 875-125 MG tablet  Acute non-recurrent frontal sinusitis - Plan: amoxicillin-clavulanate (AUGMENTIN) 875-125 MG tablet  Chronic pain in left foot - Plan: Ambulatory referral to Sports Medicine  Here today with concern of a sinus infection and resolving nail infection. At this time I do not think that he needs a nail resection as he is improving.  Will treat with augmentin for remaining tenderness and to make sure infection is cleared up . This will also cover any bacterial sinus infection although sx are likely to be viral Referral to sports med as well  Signed Abbe AmsterdamJessica Copland, MD

## 2016-04-01 NOTE — Progress Notes (Signed)
Pre visit review using our clinic review tool, if applicable. No additional management support is needed unless otherwise documented below in the visit note. 

## 2016-04-01 NOTE — Patient Instructions (Signed)
Use the augmentin as directed for your nail and sinuses If your nail is getting worse or not back to normal soon please let me know and I will refer you to a podiatrist  Dr[facility] Pearletha ForgeHudnall [facility] the [facility] does [facility]otics- I will refer you

## 2016-04-02 ENCOUNTER — Encounter: Payer: Self-pay | Admitting: Family Medicine

## 2016-04-09 ENCOUNTER — Ambulatory Visit: Payer: Medicare HMO | Admitting: Family Medicine

## 2016-04-10 ENCOUNTER — Telehealth: Payer: Self-pay | Admitting: Family Medicine

## 2016-04-10 NOTE — Telephone Encounter (Signed)
His insurance needs the code for orthotics so he will know how much he has to pay for his orthotics.  He wanted to know if we could call his insurance with that information.  The [employer] provider line is # [phone], ID# [id number]     He would like to know before his appointment tomorrow.  I told him I'm not sure we will have time due to patient care so just let me know if you can do this ??

## 2016-04-11 ENCOUNTER — Ambulatory Visit (INDEPENDENT_AMBULATORY_CARE_PROVIDER_SITE_OTHER): Payer: Medicare HMO | Admitting: Family Medicine

## 2016-04-11 ENCOUNTER — Encounter: Payer: Self-pay | Admitting: Family Medicine

## 2016-04-11 DIAGNOSIS — M25572 Pain in left ankle and joints of left foot: Secondary | ICD-10-CM | POA: Diagnosis not present

## 2016-04-11 DIAGNOSIS — G8929 Other chronic pain: Secondary | ICD-10-CM

## 2016-04-11 NOTE — Telephone Encounter (Signed)
Discussed with patient about billing for this and to discuss with insurance company regarding how much would be covered.

## 2016-04-15 DIAGNOSIS — M25572 Pain in left ankle and joints of left foot: Secondary | ICD-10-CM | POA: Insufficient documentation

## 2016-04-15 NOTE — Progress Notes (Signed)
PCP: COPLAND,JESSICA, MD  Subjective:   HPI: Patient is a 31 y.o. male here for orthotics.  Patient reports he's had several years of left foot and ankle pain. He initially had a bilateral bunionette repair in 2009. Back in May 2012 he had a pes planus corrective surgery by Dr. Raynald KempSheard; left Cotton osteotomy with allograft, achilles lengthening, and attempted subtalar arthrodesis. Diagnosed subsequently with talocalcaneal coalition, nonunion of a medial cuneiform osteotomy. Then had a left subtalar fusion, cuneiform osteotomy, allograft removal 02/2012 Has tried ankle injections (including subtalar), custom orthotics (orthotics help but current ones worn down). Pain level still at 5/10 lateral ankle into the foot. No skin changes, numbness.  Past Medical History:  Diagnosis Date  . Chronic foot pain     Current Outpatient Prescriptions on File Prior to Visit  Medication Sig Dispense Refill  . amoxicillin-clavulanate (AUGMENTIN) 875-125 MG tablet Take 1 tablet by mouth 2 (two) times daily. 20 tablet 0  . b complex vitamins capsule Take 1 capsule by mouth daily.    . cetirizine (ZYRTEC) 10 MG tablet Take 1 tablet (10 mg total) by mouth daily. (Patient taking differently: Take 10 mg by mouth daily. Pt alternates between Claritin and Zyrtec every 6 months.) 90 tablet 1  . ipratropium (ATROVENT) 0.03 % nasal spray Place 2 sprays into the nose 4 (four) times daily. 30 mL 6  . loratadine (CLARITIN) 10 MG tablet Take 10 mg by mouth daily. Pt alternates between Claritin and Zyrtec every 6 months.    . Multiple Vitamin (MULTIVITAMIN) tablet Take 1 tablet by mouth daily.    Marland Kitchen. oxyCODONE (OXY IR/ROXICODONE) 5 MG immediate release tablet Take 1 tablet (5 mg total) by mouth every 8 (eight) hours as needed. 90 tablet 0  . Probiotic Product (PROBIOTIC ADVANCED PO) Take by mouth.    . zinc gluconate 50 MG tablet Take 50 mg by mouth daily.     No current facility-administered medications on file prior  to visit.     Past Surgical History:  Procedure Laterality Date  . SUBTALAR DISTRACTION BONE BLOCK FUSION WITH ILIAC CREST BONE GRAFT Left     No Known Allergies  Social History   Social History  . Marital status: Single    Spouse name: N/A  . Number of children: N/A  . Years of education: N/A   Occupational History  . Not on file.   Social History Main Topics  . Smoking status: Former Smoker    Types: Cigarettes    Quit date: 08/30/2010  . Smokeless tobacco: Never Used  . Alcohol use Not on file     Comment: occasionally  . Drug use: No  . Sexual activity: Not on file   Other Topics Concern  . Not on file   Social History Narrative  . No narrative on file    Family History  Problem Relation Age of Onset  . Anuerysm Father   . Hypertension Father   . Hypertension Maternal Grandmother   . Hypertension Maternal Grandfather   . Cancer Mother     Ovarian    BP 124/76   Pulse (!) 59   Ht 6' (1.829 m)   Wt 190 lb (86.2 kg)   BMI 25.77 kg/m   Review of Systems: See HPI above.     Objective:  Physical Exam:  Gen: NAD, comfortable in exam room  Left foot/ankle: Mod pronation compared to pes planus on right.  Well healed surgical scars medial foot, distal lateral left foot and  lateral ankle. Mild TTP laterally distal to lateral malleolus.  No other current tenderness. Mild limitation ROM all directions of ankle. No hallux valgus or rigidus. Strength 5/5 all directions of ankle. No leg length discrepancy. NVI distally.   Assessment & Plan:  1. Left foot, ankle pain - chronic.  S/p multiple surgical procedures.  Has done well with custom orthotics but old ones are worn down.  New pair made today.    Patient was fitted for a : standard, cushioned, semi-rigid orthotic. The orthotic was heated and afterward the patient stood on the orthotic blank positioned on the orthotic stand. The patient was positioned in subtalar neutral position and 10 degrees of  ankle dorsiflexion in a weight bearing stance. After completion of molding, a stable base was applied to the orthotic blank. The blank was ground to a stable position for weight bearing. Size: 14 blue swirl Base: blue med density EVA Posting: none Additional orthotic padding: none Total prep time 45 minutes.

## 2016-04-15 NOTE — Assessment & Plan Note (Signed)
chronic.  S/p multiple surgical procedures.  Has done well with custom orthotics but old ones are worn down.  New pair made today.    Patient was fitted for a : standard, cushioned, semi-rigid orthotic. The orthotic was heated and afterward the patient stood on the orthotic blank positioned on the orthotic stand. The patient was positioned in subtalar neutral position and 10 degrees of ankle dorsiflexion in a weight bearing stance. After completion of molding, a stable base was applied to the orthotic blank. The blank was ground to a stable position for weight bearing. Size: 14 blue swirl Base: blue med density EVA Posting: none Additional orthotic padding: none Total prep time 45 minutes.

## 2016-04-16 ENCOUNTER — Other Ambulatory Visit: Payer: Self-pay | Admitting: Family Medicine

## 2016-04-16 DIAGNOSIS — G894 Chronic pain syndrome: Secondary | ICD-10-CM

## 2016-04-16 NOTE — Telephone Encounter (Signed)
Relation to pt:self °Call back number:[phone] ° ° °Reason for call:  °Patient requesting a refill  oxyCODONE (OXY IR/ROXICODONE) 5 MG immediate release tablet  °

## 2016-04-16 NOTE — Telephone Encounter (Signed)
Indication for chronic opioid: ankle fusion and pain Medication and dose: oxy 5, TID prn # pills per month: 90, but does not fill monthly Last UDS date: 12/17 Pain contract signed (Y/N): yes Date narcotic database last reviewed (include red flags): today- all ok.  Last filled #90 on 02/26/16  Will refill for him tomorrow when I am in the office Called and let him know I will RF tomorrow

## 2016-04-17 MED ORDER — OXYCODONE HCL 5 MG PO TABS: 5.0000 mg | ORAL_TABLET | Freq: Three times a day (TID) | ORAL | 0 refills | Status: DC | PRN

## 2016-08-09 ENCOUNTER — Other Ambulatory Visit: Payer: Self-pay | Admitting: Family Medicine

## 2016-08-09 ENCOUNTER — Other Ambulatory Visit: Payer: Self-pay | Admitting: Emergency Medicine

## 2016-08-09 DIAGNOSIS — G894 Chronic pain syndrome: Secondary | ICD-10-CM

## 2016-08-09 NOTE — Telephone Encounter (Signed)
Relation to pt:self °Call back number:[phone] ° ° °Reason for call:  °Patient requesting a refill  oxyCODONE (OXY IR/ROXICODONE) 5 MG immediate release tablet  °

## 2016-08-12 MED ORDER — OXYCODONE HCL 5 MG PO TABS: 5.0000 mg | ORAL_TABLET | Freq: Three times a day (TID) | ORAL | 0 refills | Status: DC | PRN

## 2016-08-12 NOTE — Telephone Encounter (Signed)
NCCSR: he last filled oxycodone 3/5- #90, a 30 day supply Last visit was in January He is on oxycodone for chronic ankle pain  He is due for a UDS soon- will ask him to do at his pick up Will refill today  [facility], please do UDS at pick up

## 2016-08-15 ENCOUNTER — Encounter: Payer: Self-pay | Admitting: Family Medicine

## 2016-08-15 DIAGNOSIS — Z79891 Long term (current) use of opiate analgesic: Secondary | ICD-10-CM | POA: Diagnosis not present

## 2016-09-26 ENCOUNTER — Encounter: Payer: Self-pay | Admitting: Family Medicine

## 2016-09-26 MED ORDER — GABAPENTIN 300 MG PO CAPS: 300.0000 mg | ORAL_CAPSULE | Freq: Three times a day (TID) | ORAL | 4 refills | Status: DC

## 2016-10-07 ENCOUNTER — Other Ambulatory Visit: Payer: Self-pay | Admitting: Family Medicine

## 2016-10-07 DIAGNOSIS — G894 Chronic pain syndrome: Secondary | ICD-10-CM

## 2016-10-07 MED ORDER — OXYCODONE HCL 5 MG PO TABS: 5.0000 mg | ORAL_TABLET | Freq: Three times a day (TID) | ORAL | 0 refills | Status: DC | PRN

## 2016-10-07 MED FILL — oxyCODONE HCL 5 MG TABS: 5 | 30 days supply | Qty: 90 | Fill #0

## 2016-10-07 NOTE — Telephone Encounter (Signed)
Rx printed, awaiting MD signature.  

## 2016-10-07 NOTE — Telephone Encounter (Signed)
Pt is requesting refill on Oxycodone 5mg . Copland Pt.  Last OV: 04/01/2016 Last Fill: 08/12/2016 #90 and 0RF UDS: 02/23/2016 Moderate risk  [place] Controlled Substance database printed; no issues noted.  Please advise.

## 2016-10-07 NOTE — Telephone Encounter (Signed)
Pt informed via MyChart that Rx has been placed at front desk for pick up.  

## 2016-10-07 NOTE — Telephone Encounter (Signed)
Okay #90, no refills. Next UDS per PCP

## 2016-11-01 DIAGNOSIS — H04123 Dry eye syndrome of bilateral lacrimal glands: Secondary | ICD-10-CM | POA: Diagnosis not present

## 2016-11-13 DIAGNOSIS — Z0101 Encounter for examination of eyes and vision with abnormal findings: Secondary | ICD-10-CM | POA: Diagnosis not present
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
## 2016-11-25 ENCOUNTER — Ambulatory Visit (INDEPENDENT_AMBULATORY_CARE_PROVIDER_SITE_OTHER): Payer: Medicare HMO | Admitting: Family Medicine

## 2016-11-25 ENCOUNTER — Encounter: Payer: Self-pay | Admitting: Family Medicine

## 2016-11-25 VITALS — BP 127/77 | HR 72 | Temp 98.0°F | Ht 71.0 in | Wt 196.2 lb

## 2016-11-25 DIAGNOSIS — Z130 Encounter for screening for diseases of the blood and blood-forming organs and certain disorders involving the immune mechanism: Secondary | ICD-10-CM

## 2016-11-25 DIAGNOSIS — Z13220 Encounter for screening for lipoid disorders: Secondary | ICD-10-CM | POA: Diagnosis not present

## 2016-11-25 DIAGNOSIS — G8929 Other chronic pain: Secondary | ICD-10-CM | POA: Diagnosis not present

## 2016-11-25 DIAGNOSIS — G894 Chronic pain syndrome: Secondary | ICD-10-CM

## 2016-11-25 DIAGNOSIS — M79672 Pain in left foot: Secondary | ICD-10-CM

## 2016-11-25 DIAGNOSIS — Z131 Encounter for screening for diabetes mellitus: Secondary | ICD-10-CM | POA: Diagnosis not present

## 2016-11-25 DIAGNOSIS — Z0000 Encounter for general adult medical examination without abnormal findings: Secondary | ICD-10-CM | POA: Diagnosis not present

## 2016-11-25 DIAGNOSIS — Z23 Encounter for immunization: Secondary | ICD-10-CM

## 2016-11-25 LAB — COMPREHENSIVE METABOLIC PANEL
ALT: 14 U/L (ref 0–53)
AST: 23 U/L (ref 0–37)
Albumin: 4.6 g/dL (ref 3.5–5.2)
Alkaline Phosphatase: 56 U/L (ref 39–117)
BILIRUBIN TOTAL: 1.1 mg/dL (ref 0.2–1.2)
BUN: 9 mg/dL (ref 6–23)
CHLORIDE: 101 meq/L (ref 96–112)
CO2: 30 mEq/L (ref 19–32)
CREATININE: 0.81 mg/dL (ref 0.40–1.50)
Calcium: 10 mg/dL (ref 8.4–10.5)
GFR: 143.63 mL/min (ref >60.00)
GLUCOSE: 89 mg/dL (ref 70–99)
Potassium: 3.9 mEq/L (ref 3.5–5.1)
SODIUM: 139 meq/L (ref 135–145)
Total Protein: 7.4 g/dL (ref 6.0–8.3)

## 2016-11-25 LAB — LIPID PANEL
CHOL/HDL RATIO: 3
Cholesterol: 230 mg/dL — ABNORMAL HIGH (ref 0–200)
HDL: 86.7 mg/dL (ref >39.00)
LDL CALC: 121 mg/dL — AB (ref 0–99)
NONHDL: 142.86
Triglycerides: 111 mg/dL (ref 0.0–149.0)
VLDL: 22.2 mg/dL (ref 0.0–40.0)

## 2016-11-25 LAB — CBC
HCT: 44.1 % (ref 39.0–52.0)
Hemoglobin: 14.9 g/dL (ref 13.0–17.0)
MCHC: 33.9 g/dL (ref 30.0–36.0)
MCV: 87.5 fl (ref 78.0–100.0)
PLATELETS: 162 10*3/uL (ref 150.0–400.0)
RBC: 5.04 Mil/uL (ref 4.22–5.81)
RDW: 12.6 % (ref 11.5–15.5)
WBC: 4.5 10*3/uL (ref 4.0–10.5)

## 2016-11-25 LAB — HEMOGLOBIN A1C: Hgb A1c MFr Bld: 5.1 % (ref 4.6–6.5)

## 2016-11-25 MED ORDER — OXYCODONE HCL 5 MG PO TABS: 5.0000 mg | ORAL_TABLET | Freq: Three times a day (TID) | ORAL | 0 refills | Status: DC | PRN

## 2016-11-25 MED ORDER — GABAPENTIN 300 MG PO CAPS: 600.0000 mg | ORAL_CAPSULE | Freq: Three times a day (TID) | ORAL | 3 refills | Status: DC
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
## 2017-02-05 ENCOUNTER — Other Ambulatory Visit: Payer: Self-pay | Admitting: Family Medicine

## 2017-02-05 DIAGNOSIS — G894 Chronic pain syndrome: Secondary | ICD-10-CM

## 2017-02-06 ENCOUNTER — Encounter: Payer: Self-pay | Admitting: Family Medicine

## 2017-02-06 MED ORDER — OXYCODONE HCL 5 MG PO TABS: 5.0000 mg | ORAL_TABLET | Freq: Three times a day (TID) | ORAL | 0 refills | Status: DC | PRN

## 2017-02-06 NOTE — Telephone Encounter (Signed)
Requesting oxycodone refill today [facility]: last filled 10/4 Contract is done He is due for annual UDS- last done 12/17 Will refill and request that he do UDS at pick up

## 2017-02-06 NOTE — Telephone Encounter (Signed)
Pt is requesting refill on Oxycodone 5mg .

## 2017-02-07 ENCOUNTER — Other Ambulatory Visit: Payer: Medicare HMO

## 2017-02-07 DIAGNOSIS — Z79899 Other long term (current) drug therapy: Secondary | ICD-10-CM | POA: Diagnosis not present

## 2017-02-12 LAB — PAIN MGMT, PROFILE 8 W/CONF, U
6 Acetylmorphine: NEGATIVE ng/mL (ref <10)
AMPHETAMINES: NEGATIVE ng/mL (ref <500)
Alcohol Metabolites: NEGATIVE ng/mL (ref <500)
Benzodiazepines: NEGATIVE ng/mL (ref <100)
Buprenorphine, Urine: NEGATIVE ng/mL (ref <5)
COCAINE METABOLITE: NEGATIVE ng/mL (ref <150)
Creatinine: 19.5 mg/dL — ABNORMAL LOW
MDMA: NEGATIVE ng/mL (ref <500)
Marijuana Metabolite: NEGATIVE ng/mL (ref <20)
Noroxycodone: 84 ng/mL — ABNORMAL HIGH (ref <50)
OXIDANT: NEGATIVE ug/mL (ref <200)
OXYMORPHONE: 52 ng/mL — AB (ref <50)
Opiates: NEGATIVE ng/mL (ref <100)
Oxycodone: NEGATIVE ng/mL (ref <50)
Oxycodone: POSITIVE ng/mL — AB (ref <100)
Specific Gravity: 1.003 (ref >=1.0)
pH: 7.58 (ref 4.5–9.0)

## 2017-04-02 DIAGNOSIS — J312 Chronic pharyngitis: Secondary | ICD-10-CM | POA: Diagnosis not present

## 2017-04-04 ENCOUNTER — Telehealth: Payer: Self-pay | Admitting: Family Medicine

## 2017-04-04 ENCOUNTER — Other Ambulatory Visit: Payer: Self-pay | Admitting: Family Medicine

## 2017-04-04 ENCOUNTER — Encounter: Payer: Self-pay | Admitting: Family Medicine

## 2017-04-04 DIAGNOSIS — G894 Chronic pain syndrome: Secondary | ICD-10-CM

## 2017-04-04 MED ORDER — OXYCODONE HCL 5 MG PO TABS: 5.0000 mg | ORAL_TABLET | Freq: Three times a day (TID) | ORAL | 0 refills | Status: DC | PRN

## 2017-04-04 NOTE — Telephone Encounter (Signed)
I have checked [facility] today Ok to refill  He is due to come in for a recheck with me in the next couple of months UDS done in November 18  Will send pt a message- he will need to be seen, I cannot order x-rays for an injury without an exam

## 2017-04-04 NOTE — Telephone Encounter (Signed)
Pt is requesting refill on oxycodone °

## 2017-04-04 NOTE — Telephone Encounter (Signed)
Copied from CRM [id number]. Topic: Quick Communication - See Telephone Encounter >> Apr 04, 2017 11:27 AM Herby AbrahamJohnson, Shiquita C wrote: CRM for notification. See Telephone encounter for: pt called in to request to have imaging orders placed. Pt says that he injured his right thumb.   Please assist further.   04/04/17.

## 2017-04-09 ENCOUNTER — Ambulatory Visit (HOSPITAL_BASED_OUTPATIENT_CLINIC_OR_DEPARTMENT_OTHER)
Admission: RE | Admit: 2017-04-09 | Discharge: 2017-04-09 | Disposition: A | Discharge status: Home / Self Care | Payer: Medicare HMO | Source: Ambulatory Visit | Attending: Family Medicine | Admitting: Family Medicine

## 2017-04-09 ENCOUNTER — Encounter: Payer: Self-pay | Admitting: Family Medicine

## 2017-04-09 ENCOUNTER — Ambulatory Visit (INDEPENDENT_AMBULATORY_CARE_PROVIDER_SITE_OTHER): Payer: Medicare HMO | Admitting: Family Medicine

## 2017-04-09 VITALS — BP 131/60 | HR 67 | Temp 97.6°F | Resp 16 | Wt 193.0 lb

## 2017-04-09 DIAGNOSIS — M79641 Pain in right hand: Secondary | ICD-10-CM | POA: Diagnosis not present

## 2017-04-09 DIAGNOSIS — M79644 Pain in right finger(s): Secondary | ICD-10-CM | POA: Diagnosis not present

## 2017-04-09 DIAGNOSIS — G894 Chronic pain syndrome: Secondary | ICD-10-CM

## 2017-04-09 MED ORDER — OXYCODONE HCL 5 MG PO TABS: 5.0000 mg | ORAL_TABLET | Freq: Three times a day (TID) | ORAL | 0 refills | Status: DC | PRN

## 2017-04-09 NOTE — Progress Notes (Signed)
[facility] [address], [address] [place], [place] [address] [phone] [phone]  Date:  04/09/2017   Name:  Douglas Nelson   DOB:  09/03/1985   MRN:  [id number]  PCP:  Pearline Cablesopland, Jessica C, MD    Chief Complaint: No chief complaint on file.   History of Present Illness:  Douglas M Bibian is a 32 y.o. very pleasant male patient who presents with the following:  Here today with concern of a thumb injury. Also, I had refilled his pain meds but he has changed pharmacy - he needs this sent to a different place.  I ended up canceling the rx I had sent in and gave him a paper rx  This past Thursday night he was pushing some recycling into the bin- he was pressing down hard and he felt like his RIGHT thumb popped out of place.  It does not seem to be dislocated at all, but he has some pain at the base of the thumb and it is still tender.  He just wants to make sure it is not broken.  Of note he is missing extension of the right thumb at the IPJ due to an injury years ago.   He is otherwise unhurt and feeling well today He is treated with oxycodone due to chronic left ankle and foot pain  Patient Active Problem List   Diagnosis Date Noted  . Left ankle pain 04/15/2016  . Hematuria 01/25/2015  . Left foot pain 12/16/2013  . Overweight (BMI 25.0-29.9) 11/08/2013  . Possible exposure to STD 06/10/2012  . Encounter for male birth control 06/10/2012  . Refusal of blood transfusions as patient is Jehovah's Witness 02/24/2012  . Coalition, talocalcaneal 09/16/2011  . Polydipsia 06/14/2011  . Allergic rhinitis, seasonal 06/14/2011  . S/P foot surgery, left 04/04/2011    Past Medical History:  Diagnosis Date  . Chronic foot pain     Past Surgical History:  Procedure Laterality Date  . SUBTALAR DISTRACTION BONE BLOCK FUSION WITH ILIAC CREST BONE GRAFT Left     Social History   Tobacco Use  . Smoking status: Former Smoker    Types:  Cigarettes    Last attempt to quit: 08/30/2010    Years since quitting: 6.6  . Smokeless tobacco: Never Used  Substance Use Topics  . Alcohol use: Not on file    Comment: occasionally  . Drug use: No    Family History  Problem Relation Age of Onset  . Anuerysm Father   . Hypertension Father   . Hypertension Maternal Grandmother   . Hypertension Maternal Grandfather   . Cancer Mother        Ovarian    No Known Allergies  Medication list has been reviewed and updated.  Current Outpatient Medications on File Prior to Visit  Medication Sig Dispense Refill  . b complex vitamins capsule Take 1 capsule by mouth daily.    . cetirizine (ZYRTEC) 10 MG tablet Take 1 tablet (10 mg total) by mouth daily. (Patient taking differently: Take 10 mg by mouth daily. Pt alternates between Claritin and Zyrtec every 6 months.) 90 tablet 1  . gabapentin (NEURONTIN) 300 MG capsule Take 2 capsules (600 mg total) by mouth 3 (three) times daily. 180 capsule 3  . ipratropium (ATROVENT) 0.03 % nasal spray Place 2 sprays into the nose 4 (four) times daily. 30 mL 6  . loratadine (CLARITIN) 10 MG tablet Take 10 mg by  mouth daily. Pt alternates between Claritin and Zyrtec every 6 months.    . Multiple Vitamin (MULTIVITAMIN) tablet Take 1 tablet by mouth daily.    Marland Kitchen oxyCODONE (OXY IR/ROXICODONE) 5 MG immediate release tablet Take 1 tablet (5 mg total) by mouth every 8 (eight) hours as needed. 90 tablet 0  . Probiotic Product (PROBIOTIC ADVANCED PO) Take by mouth.    . zinc gluconate 50 MG tablet Take 50 mg by mouth daily.     No current facility-administered medications on file prior to visit.     Review of Systems:  As per HPI- otherwise negative.   Physical Examination: Vitals:   04/09/17 1424  BP: 131/60  Pulse: 67  Resp: 16  Temp: 97.6 F (36.4 C)  SpO2: 100%   Vitals:   04/09/17 1424  Weight: 193 lb (87.5 kg)   Body mass index is 26.92 kg/m. Ideal Body Weight:    GEN: WDWN, NAD,  Non-toxic, A & O x 3, looks well HEENT: Atraumatic, Normocephalic. Neck supple. No masses, No LAD. Ears and Nose: No external deformity. CV: RRR, No M/G/R. No JVD. No thrill. No extra heart sounds. PULM: CTA B, no wheezes, crackles, rhonchi. No retractions. No resp. distress. No accessory muscle use. EXTR: No c/c/e NEURO Normal gait.  PSYCH: Normally interactive. Conversant. Not depressed or anxious appearing.  Calm demeanor.  Right thumb: there is minimal swelling of the entire digit.  The wrist and the rest of the hand is nontender.  He lacks full extension of the IP joint of the thumb, but states this is baseline from a previous injury.  The MCP joint is mildly tender,  He has normal ROM and strength of the thump except for at the IPJ as above  Dg Hand Complete Right  Result Date: 04/09/2017 CLINICAL DATA:  Right hand pain for several days following blunt trauma, initial encounter EXAM: RIGHT HAND - COMPLETE 3+ VIEW COMPARISON:  None. FINDINGS: There is no evidence of fracture or dislocation. There is no evidence of arthropathy or other focal bone abnormality. Soft tissues are unremarkable. IMPRESSION: No acute abnormality noted. Electronically Signed   By: Alcide Clever M.D.   On: 04/09/2017 15:09    Assessment and Plan: Pain of right thumb - Plan: DG Hand Complete Right  Chronic pain syndrome - Plan: oxyCODONE (OXY IR/ROXICODONE) 5 MG immediate release tablet  Thumb injury- no apparent fracture.  Discussed options with pt.  He will get an OTC thumb spica splint to wear for the next few days.  If not well by Monday plan to have him see Dr. Pearletha Forge Refilled his oxycodone for him today   Signed Abbe Amsterdam, MD

## 2017-04-09 NOTE — Patient Instructions (Signed)
Good to see you today!   Get an OTC wrist splint with a thumb support- called a thumb spica splint. Wear this for a few days- if on Monday you are still having trouble we will have you see Dr. Pearletha ForgeHudnall

## 2017-06-04 ENCOUNTER — Encounter: Payer: Self-pay | Admitting: Medical

## 2017-06-04 ENCOUNTER — Ambulatory Visit (INDEPENDENT_AMBULATORY_CARE_PROVIDER_SITE_OTHER): Payer: Medicare HMO | Admitting: Medical

## 2017-06-04 ENCOUNTER — Telehealth: Payer: Self-pay | Admitting: Family Medicine

## 2017-06-04 VITALS — BP 128/65 | HR 55 | Temp 97.7°F | Resp 16 | Ht 71.0 in | Wt 205.0 lb

## 2017-06-04 DIAGNOSIS — H109 Unspecified conjunctivitis: Secondary | ICD-10-CM

## 2017-06-04 DIAGNOSIS — H00014 Hordeolum externum left upper eyelid: Secondary | ICD-10-CM | POA: Diagnosis not present

## 2017-06-04 DIAGNOSIS — G894 Chronic pain syndrome: Secondary | ICD-10-CM

## 2017-06-04 MED ORDER — MOXIFLOXACIN HCL 0.5 % OP SOLN: 1.0000 [drp] | Freq: Three times a day (TID) | OPHTHALMIC | 0 refills | Status: DC

## 2017-06-04 MED ORDER — OXYCODONE HCL 5 MG PO TABS: 5.0000 mg | ORAL_TABLET | Freq: Three times a day (TID) | ORAL | 0 refills | Status: DC | PRN

## 2017-06-04 MED ORDER — TOBRAMYCIN 0.3 % OP SOLN: 2.0000 [drp] | Freq: Four times a day (QID) | OPHTHALMIC | 0 refills | Status: DC

## 2017-06-04 MED FILL — TOBRAMYCIN 0.3 % SOLN: 0.3 | 13 days supply | Qty: 5 | Fill #0

## 2017-06-04 NOTE — Progress Notes (Signed)
Subjective:    Patient ID: Douglas Nelson, male    DOB: 12/03/1985, 32 y.o.   MRN: [id number]  HPI  Pt in with some left eye irritation and tenderness last night. Some crusting the am to eye lashes this morning. Left upper eye lid mild swollen. Pt has 3 children. No known conjunctivitis in family members.  Pt not having allergy type signs or symptoms.   Rt eye feels normal.   No significant changes to his vision.  No trauma to eye or fb history.    Review of Systems  Constitutional: Negative for chills, fatigue and fever.  HENT:       Left eye irritatoin. See hpi.  Respiratory: Negative for cough, chest tightness, shortness of breath and wheezing.   Cardiovascular: Negative for chest pain and palpitations.  Gastrointestinal: Negative for abdominal pain.  Musculoskeletal: Negative for back pain.  Skin: Negative for rash.  Neurological: Negative for dizziness, seizures, syncope, weakness and numbness.  Hematological: Negative for adenopathy. Does not bruise/bleed easily.  Psychiatric/Behavioral: Negative for behavioral problems and confusion.    Past Medical History:  Diagnosis Date  . Chronic foot pain      Social History   Socioeconomic History  . Marital status: Married    Spouse name: Not on file  . Number of children: Not on file  . Years of education: Not on file  . Highest education level: Not on file  Occupational History  . Not on file  Social Needs  . Financial resource strain: Not on file  . Food insecurity:    Worry: Not on file    Inability: Not on file  . Transportation needs:    Medical: Not on file    Non-medical: Not on file  Tobacco Use  . Smoking status: Former Smoker    Types: Cigarettes    Last attempt to quit: 08/30/2010    Years since quitting: 6.7  . Smokeless tobacco: Never Used  Substance and Sexual Activity  . Alcohol use: Not on file    Comment: occasionally  . Drug use: No  . Sexual activity: Not on file  Lifestyle  .  Physical activity:    Days per week: Not on file    Minutes per session: Not on file  . Stress: Not on file  Relationships  . Social connections:    Talks on phone: Not on file    Gets together: Not on file    Attends religious service: Not on file    Active member of club or organization: Not on file    Attends meetings of clubs or organizations: Not on file    Relationship status: Not on file  . Intimate partner violence:    Fear of current or ex partner: Not on file    Emotionally abused: Not on file    Physically abused: Not on file    Forced sexual activity: Not on file  Other Topics Concern  . Not on file  Social History Narrative  . Not on file    Past Surgical History:  Procedure Laterality Date  . SUBTALAR DISTRACTION BONE BLOCK FUSION WITH ILIAC CREST BONE GRAFT Left     Family History  Problem Relation Age of Onset  . Anuerysm Father   . Hypertension Father   . Hypertension Maternal Grandmother   . Hypertension Maternal Grandfather   . Cancer Mother        Ovarian    No Known Allergies  Current Outpatient Medications on  File Prior to Visit  Medication Sig Dispense Refill  . b complex vitamins capsule Take 1 capsule by mouth daily.    . cetirizine (ZYRTEC) 10 MG tablet Take 1 tablet (10 mg total) by mouth daily. (Patient taking differently: Take 10 mg by mouth daily. Pt alternates between Claritin and Zyrtec every 6 months.) 90 tablet 1  . gabapentin (NEURONTIN) 300 MG capsule Take 2 capsules (600 mg total) by mouth 3 (three) times daily. 180 capsule 3  . ipratropium (ATROVENT) 0.03 % nasal spray Place 2 sprays into the nose 4 (four) times daily. 30 mL 6  . loratadine (CLARITIN) 10 MG tablet Take 10 mg by mouth daily. Pt alternates between Claritin and Zyrtec every 6 months.    . Multiple Vitamin (MULTIVITAMIN) tablet Take 1 tablet by mouth daily.    Marland Kitchen omeprazole (PRILOSEC) 10 MG capsule Take 10 mg by mouth daily.    Marland Kitchen oxyCODONE (OXY IR/ROXICODONE) 5 MG  immediate release tablet Take 1 tablet (5 mg total) by mouth every 8 (eight) hours as needed. 90 tablet 0  . Probiotic Product (PROBIOTIC ADVANCED PO) Take by mouth.    . zinc gluconate 50 MG tablet Take 50 mg by mouth daily.     No current facility-administered medications on file prior to visit.     BP 128/65   Pulse (!) 55   Temp 97.7 F (36.5 C) (Oral)   Resp 16   Ht 5\' 11"  (1.803 m)   Wt 205 lb (93 kg)   SpO2 100%   BMI 28.59 kg/m       Objective:   Physical Exam  General  Mental Status - Alert. General Appearance - Well groomed. Not in acute distress.  Skin Rashes- No Rashes.  HEENT Head- Normal. Ear Auditory Canal - Left- Normal. Right - Normal.Tympanic Membrane- Left- Normal. Right- Normal. Eye Sclera/Conjunctiva- Left- cojunctiiva clear, left upper eye lid mild swollen and in mid upper eye lash area obvious small palpable stye present Right- Normal. Nose & Sinuses Nasal Mucosa- Left-  Boggy and Congested. Right-  Boggy and  Congested.Bilateral no  maxillary and no frontal sinus pressure.   Neck Neck- Supple. No Masses.   Chest and Lung Exam Auscultation: Breath Sounds:-Clear even and unlabored.  Cardiovascular Auscultation:Rythm- Regular, rate and rhythm. Murmurs & Other Heart Sounds:Ausculatation of the heart reveal- No Murmurs.  Lymphatic Head & Neck General Head & Neck Lymphatics: Bilateral: Description- No Localized lymphadenopathy.       Assessment & Plan:  Do appear to have conjunctivitis and stye presently.  Recommend warm compresses twice daily to upper eyelid area and start tobrex eyedrops.    If you have no improvement at all by Friday morning or you feel irritation/symptoms are worse then please let me know by Friday early as that would allow me sometime to get you in with an optometrist before the weekend.  However I do think you will feel improvement in your symptoms.  Follow-up in 7 days or as needed.  I did rx vigamox initially  but apparently too expensive so I sent in rx of tobrex.  Esperanza Richters, PA-C

## 2017-06-04 NOTE — Telephone Encounter (Signed)
Pt is requesting refill on oxycodone °

## 2017-06-04 NOTE — Patient Instructions (Addendum)
Do appear to have conjunctivitis and stye presently.  Recommend warm compresses twice daily to upper eyelid area and start tobrex eyedrops.    If you have no improvement at all by Friday morning or you feel irritation/symptoms are worse then please let me know by Friday early as that would allow me sometime to get you in with an optometrist before the weekend.  However I do think you will feel improvement in your symptoms.  Follow-up in 7 days or as needed.

## 2017-06-04 NOTE — Telephone Encounter (Signed)
Last seen here in January for an injury. From my notes in September of 18:  He is also treated for chronic left foot and ankle pain with oxycodone. This is the same, he notes that he can feel changes in the weather in his foot, but otherwise his pain is about the same The gabapentin does help with the burning and "surface pain," he is taking this TID when he remembers. This is helpful for him, he would like to try going up on this a bit to see if it may help him more   Indication for chronic opioid: chronic left foot and ankle pain Medication and dose: oxycodone 5 # pills per month: 90 Last UDS date: 6/18- low risk Pain contract signed (Y/N): 2017 Date narcotic database last reviewed (include red flags): 11/23/16  N[facility] reviewed today: ok to refill, he generally gets this every 2 months

## 2017-06-05 ENCOUNTER — Encounter: Payer: Self-pay | Admitting: Family Medicine

## 2017-06-05 ENCOUNTER — Telehealth: Payer: Self-pay | Admitting: Medical

## 2017-06-05 DIAGNOSIS — G894 Chronic pain syndrome: Secondary | ICD-10-CM

## 2017-06-05 DIAGNOSIS — H1032 Unspecified acute conjunctivitis, left eye: Secondary | ICD-10-CM

## 2017-06-05 NOTE — Telephone Encounter (Signed)
Copied from CRM [id number]. Topic: General - Other >> Jun 05, 2017  4:24 PM Maia Pettiesrtiz, Kristie S wrote: Reason for CRM: pt states eye is not feeling any better and Ramon Dredgedward mentioned going to an eye doctor if not better by Friday morning. Pt is requesting to get his setup with that appt Friday if possible. His inner eye is not red but the lid is swollen and hurts to the touch, squint. Needs appt after 12pm

## 2017-06-05 NOTE — Telephone Encounter (Signed)
Referral placed to optometrist. Will you skype Gwenn tomorrow am early and point out the referral first thing. Make sure she sees this since Friday limted time typically to try to get pt in as weekend approaches.

## 2017-06-05 NOTE — Telephone Encounter (Signed)
I do not understand telephone message.  Will email pt directly to see what he needs

## 2017-06-05 NOTE — Telephone Encounter (Signed)
Would you get pt in with optometrist tomorrow morning 06/06/2017 before weekend. Thanks. See referra.

## 2017-06-05 NOTE — Telephone Encounter (Signed)
Pt requested oxycodone to be sent to  [id number] [place], [place] - [phone] (Phone) [phone] (Fax)    [facility]. It is far more expensive to use them thru in insurance.

## 2017-06-06 DIAGNOSIS — H00014 Hordeolum externum left upper eyelid: Secondary | ICD-10-CM | POA: Diagnosis not present

## 2017-06-06 DIAGNOSIS — H00024 Hordeolum internum left upper eyelid: Secondary | ICD-10-CM | POA: Diagnosis not present

## 2017-06-06 MED ORDER — OXYCODONE HCL 5 MG PO TABS: 5.0000 mg | ORAL_TABLET | Freq: Three times a day (TID) | ORAL | 0 refills | Status: DC | PRN

## 2017-06-06 NOTE — Telephone Encounter (Signed)
Notified patient that he has an appointment at [facility] today at 1:35. Patient is to arrive 20 minutes early with insurance card and ID. Patient states he is feeling better today and it has improved but thinks he should still get it looked at.

## 2017-08-07 ENCOUNTER — Encounter: Payer: Self-pay | Admitting: Family Medicine

## 2017-08-07 ENCOUNTER — Other Ambulatory Visit: Payer: Self-pay | Admitting: Family Medicine

## 2017-08-07 DIAGNOSIS — G894 Chronic pain syndrome: Secondary | ICD-10-CM

## 2017-08-08 ENCOUNTER — Encounter: Payer: Self-pay | Admitting: Family Medicine

## 2017-08-08 MED ORDER — OXYCODONE HCL 5 MG PO TABS: 5.0000 mg | ORAL_TABLET | Freq: Three times a day (TID) | ORAL | 0 refills | Status: DC | PRN

## 2017-08-08 NOTE — Telephone Encounter (Signed)
Refill request for Oxycodone.  

## 2017-10-03 ENCOUNTER — Other Ambulatory Visit: Payer: Self-pay | Admitting: Family Medicine

## 2017-10-03 DIAGNOSIS — G894 Chronic pain syndrome: Secondary | ICD-10-CM

## 2017-10-03 MED ORDER — OXYCODONE HCL 5 MG PO TABS: 5.0000 mg | ORAL_TABLET | Freq: Three times a day (TID) | ORAL | 0 refills | Status: DC | PRN

## 2017-10-03 NOTE — Telephone Encounter (Signed)
Requesting: oxycodone 5mg  every 8hr prn Contract: 2016 UDS: 02/07/17 Last OV: 06/04/17 Next Ov: 10/08/17 Last refill: 08/08/17, #90, 0RF Database: no discrepancies noted  Order pended for Dr. Patsy Lageropland review; preferred pharmacy updated with pt. over the phone.

## 2017-10-05 NOTE — Progress Notes (Deleted)
[facility] [address], [address] [place], [place] [id number] [phone] [phone]  Date:  10/08/2017   Name:  Douglas Nelson   DOB:  07/13/1985   MRN:  [id number]  PCP:  Pearline Cablesopland, Ryden Wainer C, MD    Chief Complaint: No chief complaint on file.   History of Present Illness:  Douglas M Nelson is a 32 y.o. 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 NCCSR:  10/03/2017  1  10/03/2017  Oxycodone Hcl 5 Mg Tablet  90 30 Je Cop  [id number]  Wal ([id number])  1/1 22.50 MME Medicare  [place]  08/08/2017  4  08/08/2017  Oxycodone Hcl 5 Mg Tablet  90 30 Je Cop  [id number]  [place] ([id number])  1/1 22.50 MME Private Pay  [place]  06/09/2017  4  06/06/2017  Oxycodone Hcl 5 Mg Tablet  90 30 Je Cop  [id number]  [place] ([id number])  1/1 22.50 MME Private Pay  [place]  04/09/2017  1  04/09/2017  Oxycodone Hcl 5 Mg Tablet  90 30 Je Cop  [id number]  Wal ([id number])  1/1 22.50 MME Medicare  [place]  02/07/2017  1  02/06/2017  Oxycodone Hcl 5 Mg Tablet  90 30 Je Cop  [id number]  Wal ([id number])  1/1 22.50 MME Medicare  [place]  12/12/2016  1  11/25/2016  Oxycodone Hcl 5 Mg Tablet  90 30 Je Cop  [id number]  Wal ([id number])  1/1 22.50 MME Medicare  [place]  10/07/2016  3  10/07/2016  Oxycodone Hcl 5 Mg Tablet  90 30 Irish LackJo Paz  [phone]  Med [phone]([id number])  1/1        UDS: 11/18- do today?  Contract:  Old on chart, update new today #########################  Patient Active Problem List   Diagnosis Date Noted  . Left ankle pain 04/15/2016  . Hematuria 01/25/2015  . Left foot pain 12/16/2013  . Overweight (BMI 25.0-29.9) 11/08/2013  . Possible exposure to STD 06/10/2012  . Encounter for male birth control 06/10/2012  . Refusal of blood transfusions as patient is Jehovah's Witness 02/24/2012  . Coalition, talocalcaneal 09/16/2011  . Polydipsia 06/14/2011  . Allergic rhinitis, seasonal 06/14/2011  . S/P foot surgery, left 04/04/2011    Past Medical History:   Diagnosis Date  . Chronic foot pain     Past Surgical History:  Procedure Laterality Date  . SUBTALAR DISTRACTION BONE BLOCK FUSION WITH ILIAC CREST BONE GRAFT Left     Social History   Tobacco Use  . Smoking status: Former Smoker    Types: Cigarettes    Last attempt to quit: 08/30/2010    Years since quitting: 7.1  . Smokeless tobacco: Never Used  Substance Use Topics  . Alcohol use: Not on file    Comment: occasionally  . Drug use: No    Family History  Problem Relation Age of Onset  . Anuerysm Father   . Hypertension Father   . Hypertension Maternal Grandmother   . Hypertension Maternal Grandfather   . Cancer Mother        Ovarian    No Known Allergies  Medication list has been reviewed and updated.  Current Outpatient Medications on File Prior to Visit  Medication Sig Dispense Refill  . b complex vitamins capsule Take 1 capsule by mouth daily.    . cetirizine (ZYRTEC) 10 MG tablet Take 1 tablet (  10 mg total) by mouth daily. (Patient taking differently: Take 10 mg by mouth daily. Pt alternates between Claritin and Zyrtec every 6 months.) 90 tablet 1  . gabapentin (NEURONTIN) 300 MG capsule Take 2 capsules (600 mg total) by mouth 3 (three) times daily. 180 capsule 3  . ipratropium (ATROVENT) 0.03 % nasal spray Place 2 sprays into the nose 4 (four) times daily. 30 mL 6  . loratadine (CLARITIN) 10 MG tablet Take 10 mg by mouth daily. Pt alternates between Claritin and Zyrtec every 6 months.    . Multiple Vitamin (MULTIVITAMIN) tablet Take 1 tablet by mouth daily.    Marland Kitchen omeprazole (PRILOSEC) 10 MG capsule Take 10 mg by mouth daily.    Marland Kitchen oxyCODONE (OXY IR/ROXICODONE) 5 MG immediate release tablet Take 1 tablet (5 mg total) by mouth every 8 (eight) hours as needed. 90 tablet 0  . Probiotic Product (PROBIOTIC ADVANCED PO) Take by mouth.    . tobramycin (TOBREX) 0.3 % ophthalmic solution Place 2 drops into the left eye every 6 (six) hours. 5 mL 0  . zinc gluconate 50 MG  tablet Take 50 mg by mouth daily.     No current facility-administered medications on file prior to visit.     Review of Systems:  As per HPI- otherwise negative.   Physical Examination: There were no vitals filed for this visit. There were no vitals filed for this visit. There is no height or weight on file to calculate BMI. Ideal Body Weight:    GEN: WDWN, NAD, Non-toxic, A & O x 3 HEENT: Atraumatic, Normocephalic. Neck supple. No masses, No LAD. Ears and Nose: No external deformity. CV: RRR, No M/G/R. No JVD. No thrill. No extra heart sounds. PULM: CTA B, no wheezes, crackles, rhonchi. No retractions. No resp. distress. No accessory muscle use. ABD: S, NT, ND, +BS. No rebound. No HSM. EXTR: No c/c/e NEURO Normal gait.  PSYCH: Normally interactive. Conversant. Not depressed or anxious appearing.  Calm demeanor.    Assessment and Plan: ***  Signed Abbe Amsterdam, MD

## 2017-10-08 ENCOUNTER — Other Ambulatory Visit (INDEPENDENT_AMBULATORY_CARE_PROVIDER_SITE_OTHER): Payer: Medicare HMO

## 2017-10-08 ENCOUNTER — Telehealth: Payer: Self-pay | Admitting: Family Medicine

## 2017-10-08 ENCOUNTER — Ambulatory Visit: Payer: Medicare HMO | Admitting: Family Medicine

## 2017-10-08 DIAGNOSIS — R52 Pain, unspecified: Secondary | ICD-10-CM

## 2017-10-08 DIAGNOSIS — G8929 Other chronic pain: Secondary | ICD-10-CM

## 2017-10-08 DIAGNOSIS — M79675 Pain in left toe(s): Secondary | ICD-10-CM

## 2017-10-08 NOTE — Telephone Encounter (Signed)
Copied from CRM [id number]. Topic: Quick Communication - See Telephone Encounter >> Oct 08, 2017  1:34 PM Valentina LucksMatos, Jackelin wrote: CRM for notification. See Telephone encounter for: 10/08/17.   Pt dropped off document to be filled out by provider (Parking [employer]ab[employer] Placard form - 1 page) Pt would like to be called when ready. [facility].

## 2017-10-11 LAB — PAIN MGMT, PROFILE 8 W/CONF, U
6 Acetylmorphine: NEGATIVE ng/mL (ref <10)
Alcohol Metabolites: NEGATIVE ng/mL (ref <500)
Amphetamines: NEGATIVE ng/mL (ref <500)
BENZODIAZEPINES: NEGATIVE ng/mL (ref <100)
BUPRENORPHINE, URINE: NEGATIVE ng/mL (ref <5)
Cocaine Metabolite: NEGATIVE ng/mL (ref <150)
Creatinine: 44.5 mg/dL
MARIJUANA METABOLITE: NEGATIVE ng/mL (ref <20)
MDMA: NEGATIVE ng/mL (ref <500)
Noroxycodone: 102 ng/mL — ABNORMAL HIGH (ref <50)
OPIATES: NEGATIVE ng/mL (ref <100)
OXIDANT: NEGATIVE ug/mL (ref <200)
OXYMORPHONE: 76 ng/mL — AB (ref <50)
Oxycodone: NEGATIVE ng/mL (ref <50)
Oxycodone: POSITIVE ng/mL — AB (ref <100)
pH: 6.98 (ref 4.5–9.0)

## 2017-11-20 ENCOUNTER — Other Ambulatory Visit: Payer: Self-pay | Admitting: Family Medicine

## 2017-11-20 DIAGNOSIS — G894 Chronic pain syndrome: Secondary | ICD-10-CM

## 2017-11-21 ENCOUNTER — Encounter: Payer: Self-pay | Admitting: Family Medicine

## 2017-11-21 DIAGNOSIS — G894 Chronic pain syndrome: Secondary | ICD-10-CM

## 2017-11-21 NOTE — Telephone Encounter (Signed)
Requesting: Oxy IR 5mg  every 8hrs prn Contract: 2019 UDS: 10/08/17 Last OV: 06/04/17 Next Ov: 12/01/17 Last refill: 10/03/17, #90, 0RF Database: no discrepancies found  Please advise.

## 2017-11-22 MED ORDER — OXYCODONE HCL 5 MG PO TABS: 5.0000 mg | ORAL_TABLET | Freq: Three times a day (TID) | ORAL | 0 refills | Status: DC | PRN

## 2017-11-22 NOTE — Telephone Encounter (Signed)
Pt is overdue for a follow-up Confirmed that he has an appt coming up and reminded him that he must come to it   NCCSR:  Reviewed and ok

## 2017-11-29 NOTE — Progress Notes (Addendum)
[facility] [address], [address] [place], [place] [address] [phone]  Date:  12/01/2017   Name:  Douglas Nelson   DOB:  08/02/1985   MRN:  [id number]  PCP:  Pearline Cables, MD    Chief Complaint: Annual Exam; Nail Problem (ingrown toenail, infected); and Mychart Set Up (patient's wife would like mychart to be set up for her-possible to give code?)   History of Present Illness:  Douglas M Quain is a 32 y.o. 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 His ankle is fused and he does have to use a cane.   His foot and ankle are about the same, he uses OTC meds and also oxycodone and gabapentin for the pain  He has through about having this looked at by a foot and ankle surgeon at [facility] to see if anything else can be done, but for now he is just watching it  Last seen here: January of this year  He thinks that he may have gotten an ingrown toenail on the right great toe following a recent pedicure  He has used some OTC topicals to treat this but notes that it is still sore He exercises as best as he is able.  He goes to the gym and lifts weights  No CP or SOB  His father was recently dx with prostate cancer at age 1, he will have surgery and then radiation.  Her PGF also did have prostate cancer Douglas has not noted any sx of urinary retention or other genital concerns   Labs:  UDS done in July, he is not fasting but just had some cereal  BW is due  Immun: flu needed. Tdap is UTD  3 kids, married.  His children are 29, 67 and 71 yo. They are homeschooled  He is a realtor  Indication for chronic opioid: chronic left foot and ankle pain Medication and dose: oxycodone 5 # pills per month: 90, but generally lasts longer than 1 month Last UDS date: 7/19 Pain contract signed (Y/N): 2017 Date  narcotic database last reviewed (include red flags): 11/29/17    NCCSR:  11/22/2017  1  11/22/2017  Oxycodone Hcl 5 Mg Tablet  90.00 30 Je Cop  [id number]  Wal ([id number])  0/0 22.50 MME Medicare  [place]  10/03/2017  1  10/03/2017  Oxycodone Hcl 5 Mg Tablet  90.00 30 Je Cop  [id number]  Wal ([id number])  0/0 22.50 MME Medicare  [place]  08/08/2017  4  08/08/2017  Oxycodone Hcl 5 Mg Tablet  90.00 30 Je Cop  [id number]  [place] ([id number])  0/0 22.50 MME Private Pay  [place]  06/09/2017  4  06/06/2017  Oxycodone Hcl 5 Mg Tablet  90.00 30 Je Cop  [id number]  [place] ([id number])  0/0 22.50 MME Private Pay  [place]  04/09/2017  1  04/09/2017  Oxycodone Hcl 5 Mg Tablet  90.00 30 Je Cop  [id number]  Wal ([id number])  0/0 22.50 MME Medicare  [place]  02/07/2017  1  02/06/2017  Oxycodone Hcl 5 Mg Tablet  90.00 30 Je Cop  [id number]  Wal ([id number])  0/0 22.50 MME Medicare  [place]  12/12/2016  1  11/25/2016  Oxycodone Hcl 5 Mg Tablet  90.00 30 Je Cop  [id number]  Wal ([id number])  0/0 22.50  MME Medicare  [place]  10/07/2016  3  10/07/2016  Oxycodone Hcl 5 Mg Tablet  90.00 30 Irish LackJo Paz  [phone]  Med [phone]([id number])  0/0 22.50 MME Medicare  [place]  08/14/2016  1  08/12/2016  Oxycodone Hcl 5 Mg Tablet  90.00 30 Je Cop  [id number]  Wal ([id number])  0/0         Patient Active Problem List   Diagnosis Date Noted  . Left ankle pain 04/15/2016  . Hematuria 01/25/2015  . Left foot pain 12/16/2013  . Overweight (BMI 25.0-29.9) 11/08/2013  . Refusal of blood transfusions as patient is Jehovah's Witness 02/24/2012  . Coalition, talocalcaneal 09/16/2011  . Allergic rhinitis, seasonal 06/14/2011  . S/P foot surgery, left 04/04/2011    Past Medical History:  Diagnosis Date  . Chronic foot pain     Past Surgical History:  Procedure Laterality Date  . SUBTALAR DISTRACTION BONE BLOCK FUSION WITH ILIAC CREST BONE GRAFT Left     Social History   Tobacco Use  . Smoking status: Former Smoker    Types: Cigarettes    Last attempt to quit: 08/30/2010    Years since quitting: 7.2  . Smokeless tobacco: Never Used  Substance Use  Topics  . Alcohol use: Not on file    Comment: occasionally  . Drug use: No    Family History  Problem Relation Age of Onset  . Anuerysm Father   . Hypertension Father   . Hypertension Maternal Grandmother   . Hypertension Maternal Grandfather   . Cancer Mother        Ovarian    No Known Allergies  Medication list has been reviewed and updated.  Current Outpatient Medications on File Prior to Visit  Medication Sig Dispense Refill  . b complex vitamins capsule Take 1 capsule by mouth daily.    . cetirizine (ZYRTEC) 10 MG tablet Take 1 tablet (10 mg total) by mouth daily. (Patient taking differently: Take 10 mg by mouth daily. Pt alternates between Claritin and Zyrtec every 6 months.) 90 tablet 1  . gabapentin (NEURONTIN) 300 MG capsule Take 2 capsules (600 mg total) by mouth 3 (three) times daily. 180 capsule 3  . loratadine (CLARITIN) 10 MG tablet Take 10 mg by mouth daily. Pt alternates between Claritin and Zyrtec every 6 months.    . Multiple Vitamin (MULTIVITAMIN) tablet Take 1 tablet by mouth daily.    Marland Kitchen. oxyCODONE (OXY IR/ROXICODONE) 5 MG immediate release tablet Take 1 tablet (5 mg total) by mouth every 8 (eight) hours as needed. 90 tablet 0  . Probiotic Product (PROBIOTIC ADVANCED PO) Take by mouth.    . zinc gluconate 50 MG tablet Take 50 mg by mouth daily.     No current facility-administered medications on file prior to visit.     Review of Systems:  As per HPI- otherwise negative. No fever or chills    Physical Examination: Vitals:   12/01/17 0934  BP: 122/62  Pulse: 74  Resp: 16  Temp: 98.3 F (36.8 C)  SpO2: 97%   Vitals:   12/01/17 0934  Weight: 215 lb (97.5 kg)  Height: 5\' 11"  (1.803 m)   Body mass index is 29.99 kg/m. Ideal Body Weight: Weight in (lb) to have BMI = 25: 178.9  GEN: WDWN, NAD, Non-toxic, A & O x 3, normal weight, looks well  HEENT: Atraumatic, Normocephalic. Neck supple. No masses, No LAD.  Bilateral TM wnl, oropharynx normal.   PEERL,EOMI.   Ears and Nose: No  external deformity. CV: RRR, No M/G/R. No JVD. No thrill. No extra heart sounds. PULM: CTA B, no wheezes, crackles, rhonchi. No retractions. No resp. distress. No accessory muscle use. ABD: S, NT, ND, +BS. No rebound. No HSM. EXTR: No c/c/e NEURO Normal gait for pt, uses a cane  PSYCH: Normally interactive. Conversant. Not depressed or anxious appearing.  Calm demeanor.  Left ankle is s/p fusion Right great toenail has a piece missing from the lateral border and some associated soreness but does not appear to be necessarily ingrown at this time    Assessment and Plan: Physical exam  Chronic pain syndrome - Plan: Flu Vaccine QUAD 6+ mos PF IM (Fluarix Quad PF)  Screening for diabetes mellitus - Plan: Comprehensive metabolic panel, Hemoglobin A1c  Screening for hyperlipidemia - Plan: Lipid panel  Screening for deficiency anemia - Plan: CBC  Need for influenza vaccination  Ingrown toenail - Plan: cephALEXin (KEFLEX) 500 MG capsule  Family history of prostate cancer  CPE today Labs pending as above Treat toenail with keflex.  He will let me know if not doing better Continue to treat his pain as needed.  He is really thinking about gong to [facility] for an opinion. ? Ankle replacement might be helpful for him Flu shot given today Start PSA at age 41  He also does mention tinea versicolor on the way out the door- will have him try Selsun blue shampoo, if not helpful let me know   Signed Abbe Amsterdam, MD  Received his labs, message to pt  Results for orders placed or performed in visit on 12/01/17  CBC  Result Value Ref Range   WBC 4.4 4.0 - 10.5 K/uL   RBC 5.16 4.22 - 5.81 Mil/uL   Platelets 163.0 150.0 - 400.0 K/uL   Hemoglobin 15.2 13.0 - 17.0 g/dL   HCT 16.1 09.6 - 04.5 %   MCV 86.3 78.0 - 100.0 fl   MCHC 34.3 30.0 - 36.0 g/dL   RDW 40.9 81.1 - 91.4 %  Comprehensive metabolic panel  Result Value Ref Range   Sodium 139 135 - 145 mEq/L    Potassium 4.6 3.5 - 5.1 mEq/L   Chloride 102 96 - 112 mEq/L   CO2 32 19 - 32 mEq/L   Glucose, Bld 79 70 - 99 mg/dL   BUN 13 6 - 23 mg/dL   Creatinine, Ser 7.82 0.40 - 1.50 mg/dL   Total Bilirubin 0.9 0.2 - 1.2 mg/dL   Alkaline Phosphatase 55 39 - 117 U/L   AST 62 (H) 0 - 37 U/L   ALT 75 (H) 0 - 53 U/L   Total Protein 6.7 6.0 - 8.3 g/dL   Albumin 4.3 3.5 - 5.2 g/dL   Calcium 9.8 8.4 - 95.6 mg/dL   GFR 213.08 >65.78 mL/min  Hemoglobin A1c  Result Value Ref Range   Hgb A1c MFr Bld 5.5 4.6 - 6.5 %  Lipid panel  Result Value Ref Range   Cholesterol 239 (H) 0 - 200 mg/dL   Triglycerides 46.9 0.0 - 149.0 mg/dL   HDL 62.95 >28.41 mg/dL   VLDL 32.4 0.0 - 40.1 mg/dL   LDL Cholesterol 027 (H) 0 - 99 mg/dL   Total CHOL/HDL Ratio 3    NonHDL 168.21

## 2017-12-01 ENCOUNTER — Ambulatory Visit (INDEPENDENT_AMBULATORY_CARE_PROVIDER_SITE_OTHER): Payer: Medicare HMO | Admitting: Family Medicine

## 2017-12-01 ENCOUNTER — Encounter: Payer: Self-pay | Admitting: Family Medicine

## 2017-12-01 VITALS — BP 122/62 | HR 74 | Temp 98.3°F | Resp 16 | Ht 71.0 in | Wt 215.0 lb

## 2017-12-01 DIAGNOSIS — L600 Ingrowing nail: Secondary | ICD-10-CM | POA: Diagnosis not present

## 2017-12-01 DIAGNOSIS — Z23 Encounter for immunization: Secondary | ICD-10-CM

## 2017-12-01 DIAGNOSIS — R7401 Elevation of levels of liver transaminase levels: Secondary | ICD-10-CM

## 2017-12-01 DIAGNOSIS — Z0000 Encounter for general adult medical examination without abnormal findings: Secondary | ICD-10-CM | POA: Diagnosis not present

## 2017-12-01 DIAGNOSIS — Z8042 Family history of malignant neoplasm of prostate: Secondary | ICD-10-CM | POA: Diagnosis not present

## 2017-12-01 DIAGNOSIS — Z13220 Encounter for screening for lipoid disorders: Secondary | ICD-10-CM | POA: Diagnosis not present

## 2017-12-01 DIAGNOSIS — Z130 Encounter for screening for diseases of the blood and blood-forming organs and certain disorders involving the immune mechanism: Secondary | ICD-10-CM | POA: Diagnosis not present

## 2017-12-01 DIAGNOSIS — Z131 Encounter for screening for diabetes mellitus: Secondary | ICD-10-CM

## 2017-12-01 DIAGNOSIS — R740 Nonspecific elevation of levels of transaminase and lactic acid dehydrogenase [LDH]: Principal | ICD-10-CM

## 2017-12-01 DIAGNOSIS — G894 Chronic pain syndrome: Secondary | ICD-10-CM

## 2017-12-01 LAB — CBC
HEMATOCRIT: 44.5 % (ref 39.0–52.0)
Hemoglobin: 15.2 g/dL (ref 13.0–17.0)
MCHC: 34.3 g/dL (ref 30.0–36.0)
MCV: 86.3 fl (ref 78.0–100.0)
PLATELETS: 163 10*3/uL (ref 150.0–400.0)
RBC: 5.16 Mil/uL (ref 4.22–5.81)
RDW: 13 % (ref 11.5–15.5)
WBC: 4.4 10*3/uL (ref 4.0–10.5)

## 2017-12-01 LAB — COMPREHENSIVE METABOLIC PANEL
ALBUMIN: 4.3 g/dL (ref 3.5–5.2)
ALT: 75 U/L — ABNORMAL HIGH (ref 0–53)
AST: 62 U/L — ABNORMAL HIGH (ref 0–37)
Alkaline Phosphatase: 55 U/L (ref 39–117)
BUN: 13 mg/dL (ref 6–23)
CALCIUM: 9.8 mg/dL (ref 8.4–10.5)
CHLORIDE: 102 meq/L (ref 96–112)
CO2: 32 meq/L (ref 19–32)
CREATININE: 0.79 mg/dL (ref 0.40–1.50)
GFR: 146.85 mL/min (ref >60.00)
Glucose, Bld: 79 mg/dL (ref 70–99)
Potassium: 4.6 mEq/L (ref 3.5–5.1)
SODIUM: 139 meq/L (ref 135–145)
TOTAL PROTEIN: 6.7 g/dL (ref 6.0–8.3)
Total Bilirubin: 0.9 mg/dL (ref 0.2–1.2)

## 2017-12-01 LAB — LIPID PANEL
Cholesterol: 239 mg/dL — ABNORMAL HIGH (ref 0–200)
HDL: 70.7 mg/dL (ref >39.00)
LDL CALC: 154 mg/dL — AB (ref 0–99)
NONHDL: 168.21
Total CHOL/HDL Ratio: 3
Triglycerides: 72 mg/dL (ref 0.0–149.0)
VLDL: 14.4 mg/dL (ref 0.0–40.0)

## 2017-12-01 LAB — HEMOGLOBIN A1C: HEMOGLOBIN A1C: 5.5 % (ref 4.6–6.5)

## 2017-12-01 MED ORDER — CEPHALEXIN 500 MG PO CAPS: 500.0000 mg | ORAL_CAPSULE | Freq: Three times a day (TID) | ORAL | 0 refills | Status: DC

## 2017-12-01 NOTE — Patient Instructions (Signed)
Great to see you today as always I will be in touch with your labs asap I hope that all goes well with your dad. I would recommend that we consider starting PSA screening for prostate cancer for you are age 32 Flu shot given today Let me know if you would like a referral to [facility] to discuss your ankle Keflex antibiotic for a week for your toenail- let me know if not responding well    Health Maintenance, Male A healthy lifestyle and preventive care is important for your health and wellness. Ask your health care provider about what schedule of regular examinations is right for you. What should I know about weight and diet? Eat a Healthy Diet  Eat plenty of vegetables, fruits, whole grains, low-fat dairy products, and lean protein.  Do not eat a lot of foods high in solid fats, added sugars, or salt.  Maintain a Healthy Weight Regular exercise can help you achieve or maintain a healthy weight. You should:  Do at least 150 minutes of exercise each week. The exercise should increase your heart rate and make you sweat (moderate-intensity exercise).  Do strength-training exercises at least twice a week.  Watch Your Levels of Cholesterol and Blood Lipids  Have your blood tested for lipids and cholesterol every 5 years starting at 31 years of age. If you are at high risk for heart disease, you should start having your blood tested when you are 31 years old. You may need to have your cholesterol levels checked more often if: ? Your lipid or cholesterol levels are high. ? You are older than 31 years of age. ? You are at high risk for heart disease.  What should I know about cancer screening? Many types of cancers can be detected early and may often be prevented. Lung Cancer  You should be screened every year for lung cancer if: ? You are a current smoker who has smoked for at least 30 years. ? You are a former smoker who has quit within the past 15 years.  Talk to your health care  provider about your screening options, when you should start screening, and how often you should be screened.  Colorectal Cancer  Routine colorectal cancer screening usually begins at 31 years of age and should be repeated every 5-10 years until you are 31 years old. You may need to be screened more often if early forms of precancerous polyps or small growths are found. Your health care provider may recommend screening at an earlier age if you have risk factors for colon cancer.  Your health care provider may recommend using home test kits to check for hidden blood in the stool.  A small camera at the end of a tube can be used to examine your colon (sigmoidoscopy or colonoscopy). This checks for the earliest forms of colorectal cancer.  Prostate and Testicular Cancer  Depending on your age and overall health, your health care provider may do certain tests to screen for prostate and testicular cancer.  Talk to your health care provider about any symptoms or concerns you have about testicular or prostate cancer.  Skin Cancer  Check your skin from head to toe regularly.  Tell your health care provider about any new moles or changes in moles, especially if: ? There is a change in a mole's size, shape, or color. ? You have a mole that is larger than a pencil eraser.  Always use sunscreen. Apply sunscreen liberally and repeat throughout the day.  Protect yourself by wearing long sleeves, pants, a wide-brimmed hat, and sunglasses when outside.  What should I know about heart disease, diabetes, and high blood pressure?  If you are 70-67 years of age, have your blood pressure checked every 3-5 years. If you are 37 years of age or older, have your blood pressure checked every year. You should have your blood pressure measured twice-once when you are at a hospital or clinic, and once when you are not at a hospital or clinic. Record the average of the two measurements. To check your blood pressure  when you are not at a hospital or clinic, you can use: ? An automated blood pressure machine at a pharmacy. ? A home blood pressure monitor.  Talk to your health care provider about your target blood pressure.  If you are between 46-71 years old, ask your health care provider if you should take aspirin to prevent heart disease.  Have regular diabetes screenings by checking your fasting blood sugar level. ? If you are at a normal weight and have a low risk for diabetes, have this test once every three years after the age of 54. ? If you are overweight and have a high risk for diabetes, consider being tested at a younger age or more often.  A one-time screening for abdominal aortic aneurysm (AAA) by ultrasound is recommended for men aged 65-75 years who are current or former smokers. What should I know about preventing infection? Hepatitis B If you have a higher risk for hepatitis B, you should be screened for this virus. Talk with your health care provider to find out if you are at risk for hepatitis B infection. Hepatitis C Blood testing is recommended for:  Everyone born from [birth year] through 1964.  Anyone with known risk factors for hepatitis C.  Sexually Transmitted Diseases (STDs)  You should be screened each year for STDs including gonorrhea and chlamydia if: ? You are sexually active and are younger than 31 years of age. ? You are older than 31 years of age and your health care provider tells you that you are at risk for this type of infection. ? Your sexual activity has changed since you were last screened and you are at an increased risk for chlamydia or gonorrhea. Ask your health care provider if you are at risk.  Talk with your health care provider about whether you are at high risk of being infected with HIV. Your health care provider may recommend a prescription medicine to help prevent HIV infection.  What else can I do?  Schedule regular health, dental, and eye  exams.  Stay current with your vaccines (immunizations).  Do not use any tobacco products, such as cigarettes, chewing tobacco, and e-cigarettes. If you need help quitting, ask your health care provider.  Limit alcohol intake to no more than 2 drinks per day. One drink equals 12 ounces of beer, 5 ounces of wine, or 1 ounces of hard liquor.  Do not use street drugs.  Do not share needles.  Ask your health care provider for help if you need support or information about quitting drugs.  Tell your health care provider if you often feel depressed.  Tell your health care provider if you have ever been abused or do not feel safe at home. This information is not intended to replace advice given to you by your health care provider. Make sure you discuss any questions you have with your health care provider. Document Released: 08/24/2007 Document  Revised: 10/25/2015 Document Reviewed: 11/29/2014 Elsevier Interactive Patient Education  [employer].

## 2018-01-14 ENCOUNTER — Other Ambulatory Visit: Payer: Self-pay | Admitting: Family Medicine

## 2018-01-14 ENCOUNTER — Encounter: Payer: Self-pay | Admitting: Family Medicine

## 2018-01-14 DIAGNOSIS — G894 Chronic pain syndrome: Secondary | ICD-10-CM

## 2018-01-14 MED ORDER — OXYCODONE HCL 5 MG PO TABS: 5.0000 mg | ORAL_TABLET | Freq: Three times a day (TID) | ORAL | 0 refills | Status: DC | PRN

## 2018-01-14 NOTE — Telephone Encounter (Signed)
NCCSR:  11/22/2017  1   11/22/2017  Oxycodone Hcl 5 Mg Tablet  90.00 30 Je Cop  [id number]  Wal ([id number])  0/0 22.50 MME Medicare  [place]  10/03/2017  1   10/03/2017  Oxycodone Hcl 5 Mg Tablet  90.00 30 Je Cop  [id number]  Wal ([id number])  0/0 22.50 MME Medicare  [place]  08/08/2017  4   08/08/2017  Oxycodone Hcl 5 Mg Tablet  90.00 30 Je Cop  [id number]  [place] ([id number])  0/0 22.50 MME Private Pay  [place]  06/09/2017  4   06/06/2017  Oxycodone Hcl 5 Mg Tablet  90.00 30 Je Cop  [id number]  [place] ([id number])  0/0 22.50 MME Private Pay  [place]  04/09/2017  1   04/09/2017  Oxycodone Hcl 5 Mg Tablet  90.00 30 Je Cop  [id number]  Wal ([id number])  0/0 22.50 MME Medicare  [place]  02/07/2017  1   02/06/2017  Oxycodone Hcl 5 Mg Tablet  90.00 30 Je Cop  [id number]  Wal ([id number])  0/0 22.50 MME Medicare  [place]  12/12/2016  1   11/25/2016  Oxycodone Hcl 5 Mg Tablet  90.00 30 Je Cop  [id number]  Wal ([id number])  0/0 22.50 MME Medicare  [place]  10/07/2016  3   10/07/2016  Oxycodone Hcl 5 Mg Tablet  90.00 30 Irish Lack  [id number]  Med [phone])  0/0      UDS is UTD, as is office visit Ok to refill

## 2018-01-14 NOTE — Telephone Encounter (Signed)
Please schedule him.

## 2018-01-15 ENCOUNTER — Other Ambulatory Visit (INDEPENDENT_AMBULATORY_CARE_PROVIDER_SITE_OTHER): Payer: Medicare HMO

## 2018-01-15 DIAGNOSIS — R740 Nonspecific elevation of levels of transaminase and lactic acid dehydrogenase [LDH]: Secondary | ICD-10-CM

## 2018-01-15 DIAGNOSIS — R7401 Elevation of levels of liver transaminase levels: Secondary | ICD-10-CM

## 2018-01-15 LAB — HEPATIC FUNCTION PANEL
ALT: 19 U/L (ref 0–53)
AST: 29 U/L (ref 0–37)
Albumin: 4.8 g/dL (ref 3.5–5.2)
Alkaline Phosphatase: 52 U/L (ref 39–117)
BILIRUBIN TOTAL: 2.3 mg/dL — AB (ref 0.2–1.2)
Bilirubin, Direct: 0.3 mg/dL (ref 0.0–0.3)
Total Protein: 7.3 g/dL (ref 6.0–8.3)

## 2018-01-17 ENCOUNTER — Encounter: Payer: Self-pay | Admitting: Family Medicine

## 2018-01-17 NOTE — Progress Notes (Signed)
Received his repeat liver tests-  Results for orders placed or performed in visit on 01/15/18  Hepatic function panel  Result Value Ref Range   Total Bilirubin 2.3 (H) 0.2 - 1.2 mg/dL   Bilirubin, Direct 0.3 0.0 - 0.3 mg/dL   Alkaline Phosphatase 52 39 - 117 U/L   AST 29 0 - 37 U/L   ALT 19 0 - 53 U/L   Total Protein 7.3 6.0 - 8.3 g/dL   Albumin 4.8 3.5 - 5.2 g/dL   LFTs as above- transaminases are now normal but his bili is up Message to pt- will set up an [place] for him

## 2018-01-17 NOTE — Addendum Note (Signed)
Addended by: Abbe Amsterdam C on: 01/17/2018 06:52 AM   Modules accepted: Orders

## 2018-01-26 ENCOUNTER — Ambulatory Visit (HOSPITAL_BASED_OUTPATIENT_CLINIC_OR_DEPARTMENT_OTHER)
Admission: RE | Admit: 2018-01-26 | Discharge: 2018-01-26 | Disposition: A | Discharge status: Home / Self Care | Payer: Medicare HMO | Source: Ambulatory Visit | Attending: Family Medicine | Admitting: Family Medicine

## 2018-01-26 ENCOUNTER — Ambulatory Visit (HOSPITAL_BASED_OUTPATIENT_CLINIC_OR_DEPARTMENT_OTHER): Admission: RE | Admit: 2018-01-26 | Payer: Medicare HMO | Source: Ambulatory Visit

## 2018-01-26 DIAGNOSIS — R740 Nonspecific elevation of levels of transaminase and lactic acid dehydrogenase [LDH]: Secondary | ICD-10-CM | POA: Insufficient documentation

## 2018-01-26 DIAGNOSIS — R7401 Elevation of levels of liver transaminase levels: Secondary | ICD-10-CM

## 2018-01-26 DIAGNOSIS — R748 Abnormal levels of other serum enzymes: Secondary | ICD-10-CM | POA: Diagnosis not present

## 2018-01-28 ENCOUNTER — Encounter: Payer: Self-pay | Admitting: Family Medicine

## 2018-02-04 ENCOUNTER — Encounter: Payer: Self-pay | Admitting: Internal Medicine

## 2018-02-04 ENCOUNTER — Ambulatory Visit (INDEPENDENT_AMBULATORY_CARE_PROVIDER_SITE_OTHER): Payer: Medicare HMO | Admitting: Internal Medicine

## 2018-02-04 VITALS — BP 116/70 | HR 62 | Temp 98.2°F | Resp 16 | Ht 71.0 in | Wt 219.1 lb

## 2018-02-04 DIAGNOSIS — J029 Acute pharyngitis, unspecified: Secondary | ICD-10-CM | POA: Diagnosis not present

## 2018-02-04 LAB — POCT RAPID STREP A (OFFICE): Rapid Strep A Screen: NEGATIVE

## 2018-02-04 MED ORDER — AMOXICILLIN 875 MG PO TABS: 875.0000 mg | ORAL_TABLET | Freq: Two times a day (BID) | ORAL | 0 refills | Status: DC

## 2018-02-04 NOTE — Patient Instructions (Signed)
Take antibiotics as prescribed  Will call you with the results of the culture in few days  Go to the ER if severe pain, difficulty swallowing, difficulty breathing.  Call if not improving in the next few days

## 2018-02-04 NOTE — Progress Notes (Signed)
Subjective:    Patient ID: Douglas Nelson, male    DOB: 1985-04-02, 32 y.o.   MRN: [id number]  DOS:  02/04/2018 Type of visit - description : acute  Symptoms of started yesterday with sore throat and some difficulty swallowing. Today symptoms are slightly worse. He feels that his throat is swollen. He also noticed some cough with dark sputum , patient is not sure if the mucus is coming from the chest or it was pooled on the throat. Some chest congestion is noted as well   Review of Systems No fever chills Minimal sinus congestion No headache  Past Medical History:  Diagnosis Date  . Chronic foot pain     Past Surgical History:  Procedure Laterality Date  . SUBTALAR DISTRACTION BONE BLOCK FUSION WITH ILIAC CREST BONE GRAFT Left     Social History   Socioeconomic History  . Marital status: Married    Spouse name: Not on file  . Number of children: Not on file  . Years of education: Not on file  . Highest education level: Not on file  Occupational History  . Not on file  Social Needs  . Financial resource strain: Not on file  . Food insecurity:    Worry: Not on file    Inability: Not on file  . Transportation needs:    Medical: Not on file    Non-medical: Not on file  Tobacco Use  . Smoking status: Former Smoker    Types: Cigarettes    Last attempt to quit: 08/30/2010    Years since quitting: 7.4  . Smokeless tobacco: Never Used  Substance and Sexual Activity  . Alcohol use: Not on file    Comment: occasionally  . Drug use: No  . Sexual activity: Not on file  Lifestyle  . Physical activity:    Days per week: Not on file    Minutes per session: Not on file  . Stress: Not on file  Relationships  . Social connections:    Talks on phone: Not on file    Gets together: Not on file    Attends religious service: Not on file    Active member of club or organization: Not on file    Attends meetings of clubs or organizations: Not on file    Relationship status:  Not on file  . Intimate partner violence:    Fear of current or ex partner: Not on file    Emotionally abused: Not on file    Physically abused: Not on file    Forced sexual activity: Not on file  Other Topics Concern  . Not on file  Social History Narrative  . Not on file      Allergies as of 02/04/2018   No Known Allergies     Medication List        Accurate as of 02/04/18 11:59 PM. Always use your most recent med list.          amoxicillin 875 MG tablet Commonly known as:  AMOXIL Take 1 tablet (875 mg total) by mouth 2 (two) times daily.   b complex vitamins capsule Take 1 capsule by mouth daily.   cetirizine 10 MG tablet Commonly known as:  ZYRTEC Take 1 tablet (10 mg total) by mouth daily.   gabapentin 300 MG capsule Commonly known as:  NEURONTIN Take 2 capsules (600 mg total) by mouth 3 (three) times daily.   loratadine 10 MG tablet Commonly known as:  CLARITIN Take 10  mg by mouth daily. Pt alternates between Claritin and Zyrtec every 6 months.   multivitamin tablet Take 1 tablet by mouth daily.   oxyCODONE 5 MG immediate release tablet Commonly known as:  Oxy IR/ROXICODONE Take 1 tablet (5 mg total) by mouth every 8 (eight) hours as needed.   PROBIOTIC ADVANCED PO Take by mouth.   zinc gluconate 50 MG tablet Take 50 mg by mouth daily.           Objective:   Physical Exam BP 116/70 (BP Location: Left Arm, Patient Position: Sitting, Cuff Size: Normal)   Pulse 62   Temp 98.2 F (36.8 C) (Oral)   Resp 16   Ht 5\' 11"  (1.803 m)   Wt 219 lb 2 oz (99.4 kg)   SpO2 98%   BMI 30.56 kg/m  General:   Well developed, NAD, BMI noted. HEENT:  Normocephalic . Face symmetric, atraumatic. TMs: Both sides are slightly bulged but not red Nose is not congested, sinuses no TTP Throat: Mild redness, no discharge, left tonsil seems a slightly larger than the right. No trismus, no difficulty breathing or swallowing Lungs:  CTA B Normal respiratory  effort, no intercostal retractions, no accessory muscle use. Heart: RRR,  no murmur.  No pretibial edema bilaterally  Skin: Not pale. Not jaundice Neurologic:  alert & oriented X3.  Speech normal, gait appropriate for age and unassisted Psych--  Cognition and judgment appear intact.  Cooperative with normal attention span and concentration.  Behavior appropriate. No anxious or depressed appearing.      Assessment & Plan:   Pharyngitis: Slightly asymmetric tonsilssize, he is in no distress, doubt serious or deep throat infection. Rapid strep negative, will get a culture, until then he will take amoxicillin.  Call or ER if symptoms severe, see AVS

## 2018-02-04 NOTE — Progress Notes (Signed)
Pre visit review using our clinic review tool, if applicable. No additional management support is needed unless otherwise documented below in the visit note. 

## 2018-02-06 LAB — CULTURE, GROUP A STREP
MICRO NUMBER:: 91430806
SPECIMEN QUALITY: ADEQUATE

## 2018-02-13 ENCOUNTER — Encounter: Payer: Self-pay | Admitting: Internal Medicine

## 2018-02-26 ENCOUNTER — Other Ambulatory Visit: Payer: Self-pay | Admitting: Family Medicine

## 2018-02-26 DIAGNOSIS — G894 Chronic pain syndrome: Secondary | ICD-10-CM

## 2018-02-26 MED ORDER — OXYCODONE HCL 5 MG PO TABS: 5.0000 mg | ORAL_TABLET | Freq: Three times a day (TID) | ORAL | 0 refills | Status: DC | PRN

## 2018-02-26 NOTE — Telephone Encounter (Signed)
Seen in September tox- July 19 NCCSR:  01/14/2018  1   01/14/2018  Oxycodone Hcl 5 Mg Tablet  90.00 30 Je Cop  [id number]  Wal ([id number])  0/0 22.50 MME Medicare  [place]  11/22/2017  1   11/22/2017  Oxycodone Hcl 5 Mg Tablet  90.00 30 Je Cop  [id number]  Wal ([id number])  0/0 22.50 MME Medicare  [place]  10/03/2017  1   10/03/2017  Oxycodone Hcl 5 Mg Tablet  90.00 30 Je Cop  [id number]  Wal ([id number])  0/0 22.50 MME Medicare  [place]  08/08/2017  3   08/08/2017  Oxycodone Hcl 5 Mg Tablet  90.00 30 Je Cop  [id number]  [place] ([id number])  0/0 22.50 MME Private Pay  [place]  06/09/2017  3   06/06/2017  Oxycodone Hcl 5 Mg Tablet  90.00 30 Je Cop  [id number]  [place] ([id number])  0/0 22.50 MME Private Pay  [place]  04/09/2017  1   04/09/2017  Oxycodone Hcl 5 Mg Tablet  90.00 30 Je Cop  [id number]  Wal ([id number])  0/0 22.50 MME Medicare  [place]  02/07/2017  1   02/06/2017  Oxycodone Hcl 5 Mg Tablet  90.00 30 Je Cop  [id number]  Wal ([id number])  0/0 22.50 MME Medicare  [place]  12/12/2016  1   11/25/2016  Oxycodone Hcl 5 Mg Tablet  90.00 30 Je Cop  [id number]  Wal ([id number])  0/0 22.50 MME Medicare  [place]  10/07/2016  2   10/07/2016  Oxycodone Hcl 5 Mg Tablet  90.00 30 [place]  [id number]  Med ([id number])  0/      Ok to refill oxycodone

## 2018-02-27 DIAGNOSIS — R69 Illness, unspecified: Secondary | ICD-10-CM | POA: Diagnosis not present

## 2018-03-10 DIAGNOSIS — Z0100 Encounter for examination of eyes and vision without abnormal findings: Secondary | ICD-10-CM | POA: Diagnosis not present

## 2018-04-07 DIAGNOSIS — R69 Illness, unspecified: Secondary | ICD-10-CM | POA: Diagnosis not present

## 2018-04-16 ENCOUNTER — Other Ambulatory Visit: Payer: Self-pay | Admitting: Family Medicine

## 2018-04-16 DIAGNOSIS — G894 Chronic pain syndrome: Secondary | ICD-10-CM

## 2018-04-20 MED ORDER — OXYCODONE HCL 5 MG PO TABS: 5.0000 mg | ORAL_TABLET | Freq: Three times a day (TID) | ORAL | 0 refills | Status: DC | PRN

## 2018-04-20 NOTE — Telephone Encounter (Signed)
Ok to refill but he needs to come in soon for a visit  Will remind him

## 2018-04-20 NOTE — Telephone Encounter (Signed)
UDS July Seen in office in September   02/27/2018  1   02/26/2018  Oxycodone Hcl 5 MG Tablet  90.00 30 Je Cop   [id number]   [facility] ([id number])   0  22.50 MME  Medicare   [place]  01/14/2018  1   01/14/2018  Oxycodone Hcl 5 MG Tablet  90.00 30 Je Cop   [id number]   Wal ([id number])   0  22.50 MME  Medicare   [place]  11/22/2017  1   11/22/2017  Oxycodone Hcl 5 MG Tablet  90.00 30 Je Cop   [id number]   Wal ([id number])   0  22.50 MME  Medicare   [place]  10/03/2017  1   10/03/2017  Oxycodone Hcl 5 MG Tablet  90.00 30 Je Cop   [id number]   Wal ([id number])   0  22.50 MME  Medicare   [place]  08/08/2017  3   08/08/2017  Oxycodone Hcl 5 MG Tablet  90.00 30 Je Cop   [id number]   [place] ([id number])   0  22.50 MME  Private Pay   [place]  06/09/2017  3   06/06/2017  Oxycodone Hcl 5 MG Tablet  90.00 30 Je Cop   [id number]   [place] ([id number])   0  22.50 MME  Private Pay   [place]  04/09/2017  1   04/09/2017  Oxycodone Hcl 5 MG Tablet  90.00 30 Je Cop   [id number]   Wal ([id number])   0  22.50 MME  Medicare   [place]  02/07/2017  1   02/06/2017  Oxycodone Hcl 5 MG Tablet  90.00 30 Je Cop   [id number]   Wal ([id number])   0  22.50 MME  Medicare   [place]  12/12/2016  1   11/25/2016  Oxycodone Hcl 5 MG Tablet  90.00 30 Je Cop   [id number]   Wal ([id number])   0  22.50 MME  Medicare   [place]  10/07/2016  2   10/07/2016  Oxycodone Hcl 5 MG Tablet  90.00 30 [place]   [id number]   Med ([id number])   0  22.50 MME  Medicare   [place]

## 2018-04-22 ENCOUNTER — Ambulatory Visit: Payer: Self-pay

## 2018-04-22 NOTE — Telephone Encounter (Signed)
Pt called stating that he has had abdominal pain upper left side.  He rates the pain as mild at 3.  He states for several months he has noticed that he belches up food.  He has the sensation that his food is stuck and not going down.He states he ate a small meal on Friday evening and Saturday evening vomited up the meal. He say he could still see what he had eaten. He says his abdomin is tender to touch and bloated. He has not tried any OTC medications for this. He has had regular BM's of normal consistency and color. Appointment scheduled per protocol. Care advice read to patient. Pt verbalized understanding of all instructions. Reason for Disposition . [1] MODERATE pain (e.g., interferes with normal activities) AND [2] pain comes and goes (cramps) AND [3] present > 24 hours  (Exception: pain with Vomiting or Diarrhea - see that Guideline)  Answer Assessment - Initial Assessment Questions 1. LOCATION: "Where does it hurt?"      Upper left side 2. RADIATION: "Does the pain shoot anywhere else?" (e.g., chest, back)    no 3. ONSET: "When did the pain begin?" (Minutes, hours or days ago)     Friday night or early Saturnday morning 4. SUDDEN: "Gradual or sudden onset?"     Sudden but for several months burps up food 5. PATTERN "Does the pain come and go, or is it constant?"    - If constant: "Is it getting better, staying the same, or worsening?"      (Note: Constant means the pain never goes away completely; most serious pain is constant and it progresses)     - If intermittent: "How long does it last?" "Do you have pain now?"     (Note: Intermittent means the pain goes away completely between bouts)    continuous 6. SEVERITY: "How bad is the pain?"  (e.g., Scale 1-10; mild, moderate, or severe)    - MILD (1-3): doesn't interfere with normal activities, abdomen soft and not tender to touch     - MODERATE (4-7): interferes with normal activities or awakens from sleep, tender to touch     - SEVERE  (8-10): excruciating pain, doubled over, unable to do any normal activities       3 7. RECURRENT SYMPTOM: "Have you ever had this type of abdominal pain before?" If so, ask: "When was the last time?" and "What happened that time?"      no 8. CAUSE: "What do you think is causing the abdominal pain?"     Unsure feels like nothing is going into stomach 9. RELIEVING/AGGRAVATING FACTORS: "What makes it better or worse?" (e.g., movement, antacids, bowel movement)     No nothing 10. OTHER SYMPTOMS: "Has there been any vomiting, diarrhea, constipation, or urine problems?"       no  Protocols used: ABDOMINAL PAIN - MALE-A-AH

## 2018-04-23 ENCOUNTER — Encounter: Payer: Self-pay | Admitting: Family Medicine

## 2018-04-23 ENCOUNTER — Ambulatory Visit (HOSPITAL_BASED_OUTPATIENT_CLINIC_OR_DEPARTMENT_OTHER)
Admission: RE | Admit: 2018-04-23 | Discharge: 2018-04-23 | Disposition: A | Discharge status: Home / Self Care | Payer: Medicare HMO | Source: Ambulatory Visit | Attending: Family Medicine | Admitting: Family Medicine

## 2018-04-23 ENCOUNTER — Ambulatory Visit (INDEPENDENT_AMBULATORY_CARE_PROVIDER_SITE_OTHER): Payer: Medicare HMO | Admitting: Family Medicine

## 2018-04-23 VITALS — BP 110/80 | HR 69 | Temp 98.2°F | Ht 72.0 in | Wt 223.0 lb

## 2018-04-23 DIAGNOSIS — R140 Abdominal distension (gaseous): Secondary | ICD-10-CM | POA: Diagnosis not present

## 2018-04-23 DIAGNOSIS — R1110 Vomiting, unspecified: Secondary | ICD-10-CM

## 2018-04-23 DIAGNOSIS — R195 Other fecal abnormalities: Secondary | ICD-10-CM | POA: Diagnosis not present

## 2018-04-23 MED ORDER — PANTOPRAZOLE SODIUM 40 MG PO TBEC: 40.0000 mg | DELAYED_RELEASE_TABLET | Freq: Every day | ORAL | 0 refills | Status: DC

## 2018-04-23 NOTE — Patient Instructions (Addendum)
We will be in touch regarding your X-ray.   Keep the diet clean.  The only lifestyle changes that have data behind them are weight loss for the overweight/obese and elevating the head of the bed. Finding out which foods/positions are triggers is important.  Let us know if you need anything.

## 2018-04-23 NOTE — Progress Notes (Signed)
Chief Complaint  Patient presents with  . Abdominal Pain    Douglas Nelson is here for abdominal distension.  Duration: 5 days Nighttime awakenings? No Bleeding? No Weight loss? No Palliation: None Not associated with meals.  Provocation: valsalva/increased pressure, bending over (while stretching he noticed) Associated symptoms: nausea, regurgitation of food up to back of throat Denies: fever and vomiting Treatment to date: MiraLAX  ROS: Constitutional: No fevers GI: No N/V/D/C, no bleeding + distension  Past Medical History:  Diagnosis Date  . Chronic foot pain     BP 110/80 (BP Location: Left Arm, Patient Position: Sitting, Cuff Size: Large)   Pulse 69   Temp 98.2 F (36.8 C) (Oral)   Ht 6' (1.829 m)   Wt 223 lb (101.2 kg)   SpO2 99%   BMI 30.24 kg/m  Gen.: Awake, alert, appears stated age HEENT: Mucous membranes moist without mucosal lesions Heart: Regular rate and rhythm without murmurs Lungs: Clear auscultation bilaterally, no rales or wheezing, normal effort without accessory muscle use. Abdomen: Bowel sounds are present. Abdomen is soft, nontender, very mildly distended, no masses or organomegaly. Negative Murphy's, Rovsing's, McBurney's, and Carnett's sign. Psych: Age appropriate judgment and insight. Normal mood and affect.  Abdominal distension - Plan: DG Abd 2 Views  Regurgitation of food - Plan: pantoprazole (PROTONIX) 40 MG tablet  Ck above. Counseled on diet, elevation of hob. Trial PPI for regurg. Has appt with GI in 2 weeks. F/u w our team prn. Pt voiced understanding and agreement to the plan.  Jilda [place], DO 04/23/18 9:55 AM

## 2018-05-07 DIAGNOSIS — R194 Change in bowel habit: Secondary | ICD-10-CM | POA: Diagnosis not present

## 2018-05-07 DIAGNOSIS — K219 Gastro-esophageal reflux disease without esophagitis: Secondary | ICD-10-CM | POA: Diagnosis not present

## 2018-05-07 DIAGNOSIS — K5900 Constipation, unspecified: Secondary | ICD-10-CM | POA: Diagnosis not present

## 2018-05-12 ENCOUNTER — Telehealth: Payer: Self-pay | Admitting: *Deleted

## 2018-05-12 NOTE — Telephone Encounter (Signed)
Received request for Medical Records from Charlott Rakes, MD Gastroenterology; forwarded to Medical Records via email/scan/SLS 03/03

## 2018-05-22 ENCOUNTER — Other Ambulatory Visit: Payer: Self-pay | Admitting: Family Medicine

## 2018-05-22 DIAGNOSIS — G894 Chronic pain syndrome: Secondary | ICD-10-CM

## 2018-05-25 MED ORDER — OXYCODONE HCL 5 MG PO TABS: 5.0000 mg | ORAL_TABLET | Freq: Three times a day (TID) | ORAL | 0 refills | Status: DC | PRN

## 2018-05-25 NOTE — Telephone Encounter (Signed)
Office visit is due, but we are in COVID outbreak Tox screen UTD Refilled

## 2018-06-04 ENCOUNTER — Other Ambulatory Visit: Payer: Self-pay | Admitting: Family Medicine

## 2018-06-04 DIAGNOSIS — R1110 Vomiting, unspecified: Secondary | ICD-10-CM

## 2018-06-04 MED ORDER — PANTOPRAZOLE SODIUM 40 MG PO TBEC: 40.0000 mg | DELAYED_RELEASE_TABLET | Freq: Every day | ORAL | 3 refills | Status: DC

## 2018-07-29 ENCOUNTER — Encounter: Payer: Self-pay | Admitting: Family Medicine

## 2018-07-29 ENCOUNTER — Other Ambulatory Visit: Payer: Self-pay | Admitting: Family Medicine

## 2018-07-29 DIAGNOSIS — G894 Chronic pain syndrome: Secondary | ICD-10-CM

## 2018-07-29 MED ORDER — OXYCODONE HCL 5 MG PO TABS: 5.0000 mg | ORAL_TABLET | Freq: Three times a day (TID) | ORAL | 0 refills | Status: DC | PRN

## 2018-10-05 ENCOUNTER — Encounter: Payer: Self-pay | Admitting: Family Medicine

## 2018-10-05 ENCOUNTER — Other Ambulatory Visit: Payer: Self-pay | Admitting: Family Medicine

## 2018-10-05 DIAGNOSIS — G894 Chronic pain syndrome: Secondary | ICD-10-CM

## 2018-10-05 MED ORDER — OXYCODONE HCL 5 MG PO TABS: 5.0000 mg | ORAL_TABLET | Freq: Three times a day (TID) | ORAL | 0 refills | Status: DC | PRN

## 2018-10-08 ENCOUNTER — Telehealth: Payer: Self-pay | Admitting: Family Medicine

## 2018-10-08 NOTE — Telephone Encounter (Signed)
CALLD PT FOR APPT LEFT VM TO CALLBACK PER CRM

## 2018-12-04 ENCOUNTER — Other Ambulatory Visit: Payer: Self-pay

## 2018-12-05 NOTE — Progress Notes (Addendum)
[facility] [address], [address] [place], [place] [address] [phone] [phone]  Date:  12/07/2018   Name:  Douglas Nelson   DOB:  05/16/1985   MRN:  [id number]  PCP:  Douglas Nelson,  C, MD    Chief Complaint: Annual Exam (flu shot) and flu shto   History of Present Illness:  Douglas Nelson is a 33 y.o. 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  He has had a subtalar fusion with iliac crest bone graft.  The ankle is now fused he does use a cane  Family history of prostate cancer; his grandfather had this.   His dad was dx with prostate cancer last year.  He is [age] He is married to Douglas Nelson, has 3 children ages 516, 4614, [address]11- they do home-school at baseline  Works as a Veterinary surgeonrealtor; work has been a bit slow, but he is still active   Flu shot- today Needs routine labs and annual UDS today Douglas uses oxycodone as needed for his chronic ankle pain His pain is stable- no change He is trying to be more active- he is doing some pull-ups at home and ride a bike. He wants to keep his left leg as strong as he can No CP or SOB   Wt Readings from Last 3 Encounters:  12/07/18 223 lb (101.2 kg)  04/23/18 223 lb (101.2 kg)  02/04/18 219 lb 2 oz (99.4 kg)    10/06/2018  2   10/05/2018  Oxycodone Hcl 5 MG Tablet  90.00  30 Je Cop   [id number]   Wal ([id number])   0  22.50 MME  Medicare   [place]  07/30/2018  2   07/29/2018  Oxycodone Hcl 5 MG Tablet  90.00  30 Je Cop   [id number]   Wal ([id number])   0  22.50 MME  Medicare   [place]  05/25/2018  2   05/25/2018  Oxycodone Hcl 5 MG Tablet  90.00  30 Je Cop   [id number]   Wal ([id number])   0  22.50 MME  Medicare   [place]  04/20/2018  2   04/20/2018  Oxycodone Hcl 5 MG Tablet  90.00  30 Je Cop   [id number]   Wal ([id number])   0  22.50 MME  Medicare   [place]   02/27/2018  2   02/26/2018  Oxycodone Hcl 5 MG Tablet  90.00  30 Je Cop   [id number]   Wal ([id number])   0  22.50 MME  Medicare   [place]  01/14/2018  2   01/14/2018  Oxycodone Hcl 5 MG Tablet  90.00  30 Je Cop   [id number]   Wal ([id number])   0  22.50 MME  Medicare   [place]  11/22/2017  2   11/22/2017  Oxycodone Hcl 5 MG Tablet  90.00  30 Je Cop   [id number]   Wal ([id number])   0  22.50 MME  Medicare   [place]  10/03/2017  2   10/03/2017  Oxycodone Hcl 5 MG Tablet  90.00  30 Je Cop   [id number]   Wal ([id number])   0  22.50 MME  Medicare   [place]  08/08/2017  1   08/08/2017  Oxycodone Hcl 5 MG Tablet  90.00  30 Je Cop   [id number]   [place] ([id number])   0  22.50 MME       Patient Active Problem List   Diagnosis Date Noted  . Left ankle pain 04/15/2016  . Hematuria 01/25/2015  . Left foot pain 12/16/2013  . Overweight (BMI 25.0-29.9) 11/08/2013  . Coalition, talocalcaneal 09/16/2011  . Allergic rhinitis, seasonal 06/14/2011  . S/P foot surgery, left 04/04/2011    Past Medical History:  Diagnosis Date  . Chronic foot pain     Past Surgical History:  Procedure Laterality Date  . SUBTALAR DISTRACTION BONE BLOCK FUSION WITH ILIAC CREST BONE GRAFT Left     Social History   Tobacco Use  . Smoking status: Former Smoker    Types: Cigarettes    Quit date: 08/30/2010    Years since quitting: 8.2  . Smokeless tobacco: Never Used  Substance Use Topics  . Alcohol use: Not on file    Comment: occasionally  . Drug use: No    Family History  Problem Relation Age of Onset  . Hypertension Maternal Grandmother   . Hypertension Maternal Grandfather   . Anuerysm Father   . Hypertension Father   . Cancer Mother        Ovarian    No Known Allergies  Medication list has been reviewed and updated.  Current Outpatient Medications on File Prior to Visit  Medication Sig Dispense Refill  . b complex vitamins capsule Take 1 capsule by mouth daily.    . cetirizine (ZYRTEC) 10 MG tablet Take 1 tablet (10 mg total) by mouth daily. (Patient taking  differently: Take 10 mg by mouth daily. Pt alternates between Claritin and Zyrtec every 6 months.) 90 tablet 1  . loratadine (CLARITIN) 10 MG tablet Take 10 mg by mouth daily. Pt alternates between Claritin and Zyrtec every 6 months.    . Multiple Vitamin (MULTIVITAMIN) tablet Take 1 tablet by mouth daily.    Marland Kitchen oxyCODONE (OXY IR/ROXICODONE) 5 MG immediate release tablet Take 1 tablet (5 mg total) by mouth every 8 (eight) hours as needed. 90 tablet 0  . pantoprazole (PROTONIX) 40 MG tablet Take 1 tablet (40 mg total) by mouth daily. 90 tablet 3  . Probiotic Product (PROBIOTIC ADVANCED PO) Take by mouth.    . zinc gluconate 50 MG tablet Take 50 mg by mouth daily.     No current facility-administered medications on file prior to visit.     Review of Systems:  As per HPI- otherwise negative. No fever or chills He has not been ill but would like a covid ab test if possible   Physical Examination: Vitals:   12/07/18 0948  BP: 122/78  Pulse: 68  Resp: 16  Temp: (!) 97.1 F (36.2 C)  SpO2: 97%   Vitals:   12/07/18 0948  Weight: 223 lb (101.2 kg)  Height: 6' (1.829 m)   Body mass index is 30.24 kg/m. Ideal Body Weight: Weight in (lb) to have BMI = 25: 183.9  GEN: WDWN, NAD, Non-toxic, A & O x 3, overweight, looks well  HEENT: Atraumatic, Normocephalic. Neck supple. No masses, No LAD. Ears and Nose: No external deformity. CV: RRR, No M/G/R. No JVD. No thrill. No extra heart sounds. PULM: CTA B, no wheezes, crackles, rhonchi. No retractions. No resp. distress. No accessory muscle use. ABD: S, NT, ND, +BS. No rebound. No HSM. EXTR: No c/c/e NEURO Normal gait for pt, uses a cane  PSYCH: Normally interactive. Conversant. Not depressed  or anxious appearing.  Calm demeanor. '  Assessment and Plan: Physical exam  Screening for diabetes mellitus - Plan: Comprehensive metabolic panel, Hemoglobin A1c  Chronic pain syndrome - Plan: Pain Mgmt, Profile 8 w/Conf, U, oxyCODONE (OXY  IR/ROXICODONE) 5 MG immediate release tablet  Screening for hyperlipidemia - Plan: Lipid panel  Screening for deficiency anemia - Plan: CBC  S/P foot surgery, left - Plan: Pain Mgmt, Profile 8 w/Conf, U  Left foot pain - Plan: Pain Mgmt, Profile 8 w/Conf, U  History of exposure to infectious disease - Plan: SAR CoV2 Serology (COVID 19)AB(IGG)IA  CPE today  Labs pending as above Flu shot given Will plan further follow- up pending labs. Refilled pain meds today  Plan to visit in about 6 months   Signed Lamar Blinks, MD  Received his labs, message to pt  Results for orders placed or performed in visit on 12/07/18  CBC  Result Value Ref Range   WBC 4.3 4.0 - 10.5 K/uL   RBC 5.28 4.22 - 5.81 Mil/uL   Platelets 169.0 150.0 - 400.0 K/uL   Hemoglobin 15.6 13.0 - 17.0 g/dL   HCT 45.9 39.0 - 52.0 %   MCV 87.1 78.0 - 100.0 fl   MCHC 34.1 30.0 - 36.0 g/dL   RDW 13.0 11.5 - 15.5 %  Comprehensive metabolic panel  Result Value Ref Range   Sodium 140 135 - 145 mEq/L   Potassium 4.4 3.5 - 5.1 mEq/L   Chloride 101 96 - 112 mEq/L   CO2 31 19 - 32 mEq/L   Glucose, Bld 84 70 - 99 mg/dL   BUN 6 6 - 23 mg/dL   Creatinine, Ser 0.87 0.40 - 1.50 mg/dL   Total Bilirubin 1.7 (H) 0.2 - 1.2 mg/dL   Alkaline Phosphatase 67 39 - 117 U/L   AST 39 (H) 0 - 37 U/L   ALT 22 0 - 53 U/L   Total Protein 7.2 6.0 - 8.3 g/dL   Albumin 4.7 3.5 - 5.2 g/dL   Calcium 10.5 8.4 - 10.5 mg/dL   GFR 122.81 >60.00 mL/min  Hemoglobin A1c  Result Value Ref Range   Hgb A1c MFr Bld 5.4 4.6 - 6.5 %  Lipid panel  Result Value Ref Range   Cholesterol 227 (H) 0 - 200 mg/dL   Triglycerides 85.0 0.0 - 149.0 mg/dL   HDL 71.30 >39.00 mg/dL   VLDL 17.0 0.0 - 40.0 mg/dL   LDL Cholesterol 139 (H) 0 - 99 mg/dL   Total CHOL/HDL Ratio 3    NonHDL 155.69    Received his labs, message to patient

## 2018-12-05 NOTE — Patient Instructions (Addendum)
It was great to see you today, I will be in touch with your labs ASAP Flu shot given today Refilled oxycodone today   Take care!!    Health Maintenance, Male Adopting a healthy lifestyle and getting preventive care are important in promoting health and wellness. Ask your health care provider about:  The right schedule for you to have regular tests and exams.  Things you can do on your own to prevent diseases and keep yourself healthy. What should I know about diet, weight, and exercise? Eat a healthy diet   Eat a diet that includes plenty of vegetables, fruits, low-fat dairy products, and lean protein.  Do not eat a lot of foods that are high in solid fats, added sugars, or sodium. Maintain a healthy weight Body mass index (BMI) is a measurement that can be used to identify possible weight problems. It estimates body fat based on height and weight. Your health care provider can help determine your BMI and help you achieve or maintain a healthy weight. Get regular exercise Get regular exercise. This is one of the most important things you can do for your health. Most adults should:  Exercise for at least 150 minutes each week. The exercise should increase your heart rate and make you sweat (moderate-intensity exercise).  Do strengthening exercises at least twice a week. This is in addition to the moderate-intensity exercise.  Spend less time sitting. Even light physical activity can be beneficial. Watch cholesterol and blood lipids Have your blood tested for lipids and cholesterol at 32 years of age, then have this test every 5 years. You may need to have your cholesterol levels checked more often if:  Your lipid or cholesterol levels are high.  You are older than 32 years of age.  You are at high risk for heart disease. What should I know about cancer screening? Many types of cancers can be detected early and may often be prevented. Depending on your health history and family  history, you may need to have cancer screening at various ages. This may include screening for:  Colorectal cancer.  Prostate cancer.  Skin cancer.  Lung cancer. What should I know about heart disease, diabetes, and high blood pressure? Blood pressure and heart disease  High blood pressure causes heart disease and increases the risk of stroke. This is more likely to develop in people who have high blood pressure readings, are of African descent, or are overweight.  Talk with your health care provider about your target blood pressure readings.  Have your blood pressure checked: ? Every 3-5 years if you are 32-29 years of age. ? Every year if you are 32 years old or older.  If you are between the ages of 53 and 64 and are a current or former smoker, ask your health care provider if you should have a one-time screening for abdominal aortic aneurysm (AAA). Diabetes Have regular diabetes screenings. This checks your fasting blood sugar level. Have the screening done:  Once every three years after age 50 if you are at a normal weight and have a low risk for diabetes.  More often and at a younger age if you are overweight or have a high risk for diabetes. What should I know about preventing infection? Hepatitis B If you have a higher risk for hepatitis B, you should be screened for this virus. Talk with your health care provider to find out if you are at risk for hepatitis B infection. Hepatitis C Blood testing  is recommended for:  Everyone born from [birth year] through 1965.  Anyone with known risk factors for hepatitis C. Sexually transmitted infections (STIs)  You should be screened each year for STIs, including gonorrhea and chlamydia, if: ? You are sexually active and are younger than 32 years of age. ? You are older than 32 years of age and your health care provider tells you that you are at risk for this type of infection. ? Your sexual activity has changed since you were last  screened, and you are at increased risk for chlamydia or gonorrhea. Ask your health care provider if you are at risk.  Ask your health care provider about whether you are at high risk for HIV. Your health care provider may recommend a prescription medicine to help prevent HIV infection. If you choose to take medicine to prevent HIV, you should first get tested for HIV. You should then be tested every 3 months for as long as you are taking the medicine. Follow these instructions at home: Lifestyle  Do not use any products that contain nicotine or tobacco, such as cigarettes, e-cigarettes, and chewing tobacco. If you need help quitting, ask your health care provider.  Do not use street drugs.  Do not share needles.  Ask your health care provider for help if you need support or information about quitting drugs. Alcohol use  Do not drink alcohol if your health care provider tells you not to drink.  If you drink alcohol: ? Limit how much you have to 0-2 drinks a day. ? Be aware of how much alcohol is in your drink. In the [place] one drink equals one 12 oz bottle of beer (355 mL), one 5 oz glass of wine (148 mL), or one 1 oz glass of hard liquor (44 mL). General instructions  Schedule regular health, dental, and eye exams.  Stay current with your vaccines.  Tell your health care provider if: ? You often feel depressed. ? You have ever been abused or do not feel safe at home. Summary  Adopting a healthy lifestyle and getting preventive care are important in promoting health and wellness.  Follow your health care provider's instructions about healthy diet, exercising, and getting tested or screened for diseases.  Follow your health care provider's instructions on monitoring your cholesterol and blood pressure. This information is not intended to replace advice given to you by your health care provider. Make sure you discuss any questions you have with your health care provider. Document  Released: 08/24/2007 Document Revised: 02/18/2018 Document Reviewed: 02/18/2018 Elsevier Patient Education  2020 [employer].

## 2018-12-07 ENCOUNTER — Encounter: Payer: Self-pay | Admitting: Family Medicine

## 2018-12-07 ENCOUNTER — Other Ambulatory Visit: Payer: Self-pay

## 2018-12-07 ENCOUNTER — Ambulatory Visit (INDEPENDENT_AMBULATORY_CARE_PROVIDER_SITE_OTHER): Payer: Medicare HMO | Admitting: Family Medicine

## 2018-12-07 VITALS — BP 122/78 | HR 68 | Temp 97.1°F | Resp 16 | Ht 72.0 in | Wt 223.0 lb

## 2018-12-07 DIAGNOSIS — Z0000 Encounter for general adult medical examination without abnormal findings: Secondary | ICD-10-CM | POA: Diagnosis not present

## 2018-12-07 DIAGNOSIS — M79672 Pain in left foot: Secondary | ICD-10-CM

## 2018-12-07 DIAGNOSIS — Z23 Encounter for immunization: Secondary | ICD-10-CM | POA: Diagnosis not present

## 2018-12-07 DIAGNOSIS — Z13220 Encounter for screening for lipoid disorders: Secondary | ICD-10-CM

## 2018-12-07 DIAGNOSIS — G894 Chronic pain syndrome: Secondary | ICD-10-CM | POA: Diagnosis not present

## 2018-12-07 DIAGNOSIS — Z9889 Other specified postprocedural states: Secondary | ICD-10-CM

## 2018-12-07 DIAGNOSIS — Z209 Contact with and (suspected) exposure to unspecified communicable disease: Secondary | ICD-10-CM | POA: Diagnosis not present

## 2018-12-07 DIAGNOSIS — Z131 Encounter for screening for diabetes mellitus: Secondary | ICD-10-CM

## 2018-12-07 DIAGNOSIS — Z130 Encounter for screening for diseases of the blood and blood-forming organs and certain disorders involving the immune mechanism: Secondary | ICD-10-CM | POA: Diagnosis not present

## 2018-12-07 LAB — COMPREHENSIVE METABOLIC PANEL
ALT: 22 U/L (ref 0–53)
AST: 39 U/L — ABNORMAL HIGH (ref 0–37)
Albumin: 4.7 g/dL (ref 3.5–5.2)
Alkaline Phosphatase: 67 U/L (ref 39–117)
BUN: 6 mg/dL (ref 6–23)
CO2: 31 mEq/L (ref 19–32)
Calcium: 10.5 mg/dL (ref 8.4–10.5)
Chloride: 101 mEq/L (ref 96–112)
Creatinine, Ser: 0.87 mg/dL (ref 0.40–1.50)
GFR: 122.81 mL/min (ref >60.00)
Glucose, Bld: 84 mg/dL (ref 70–99)
Potassium: 4.4 mEq/L (ref 3.5–5.1)
Sodium: 140 mEq/L (ref 135–145)
Total Bilirubin: 1.7 mg/dL — ABNORMAL HIGH (ref 0.2–1.2)
Total Protein: 7.2 g/dL (ref 6.0–8.3)

## 2018-12-07 LAB — CBC
HCT: 45.9 % (ref 39.0–52.0)
Hemoglobin: 15.6 g/dL (ref 13.0–17.0)
MCHC: 34.1 g/dL (ref 30.0–36.0)
MCV: 87.1 fl (ref 78.0–100.0)
Platelets: 169 10*3/uL (ref 150.0–400.0)
RBC: 5.28 Mil/uL (ref 4.22–5.81)
RDW: 13 % (ref 11.5–15.5)
WBC: 4.3 10*3/uL (ref 4.0–10.5)

## 2018-12-07 LAB — HEMOGLOBIN A1C: Hgb A1c MFr Bld: 5.4 % (ref 4.6–6.5)

## 2018-12-07 LAB — LIPID PANEL
Cholesterol: 227 mg/dL — ABNORMAL HIGH (ref 0–200)
HDL: 71.3 mg/dL (ref >39.00)
LDL Cholesterol: 139 mg/dL — ABNORMAL HIGH (ref 0–99)
NonHDL: 155.69
Total CHOL/HDL Ratio: 3
Triglycerides: 85 mg/dL (ref 0.0–149.0)
VLDL: 17 mg/dL (ref 0.0–40.0)

## 2018-12-07 MED ORDER — OXYCODONE HCL 5 MG PO TABS: 5.0000 mg | ORAL_TABLET | Freq: Three times a day (TID) | ORAL | 0 refills | Status: DC | PRN

## 2018-12-09 LAB — PAIN MGMT, PROFILE 8 W/CONF, U
6 Acetylmorphine: NEGATIVE ng/mL
Alcohol Metabolites: NEGATIVE ng/mL (ref <500)
Amphetamines: NEGATIVE ng/mL
Benzodiazepines: NEGATIVE ng/mL
Buprenorphine, Urine: NEGATIVE ng/mL
Cocaine Metabolite: NEGATIVE ng/mL
Creatinine: 51.1 mg/dL
MDMA: NEGATIVE ng/mL
Marijuana Metabolite: NEGATIVE ng/mL
Noroxycodone: 139 ng/mL
Opiates: NEGATIVE ng/mL
Oxidant: NEGATIVE ug/mL
Oxycodone: NEGATIVE ng/mL
Oxycodone: POSITIVE ng/mL
Oxymorphone: 76 ng/mL
pH: 7.6 (ref 4.5–9.0)

## 2018-12-09 LAB — SAR COV2 SEROLOGY (COVID19)AB(IGG),IA: SARS CoV2 AB IGG: NEGATIVE

## 2018-12-16 DIAGNOSIS — H52223 Regular astigmatism, bilateral: Secondary | ICD-10-CM | POA: Diagnosis not present

## 2018-12-16 DIAGNOSIS — H43811 Vitreous degeneration, right eye: Secondary | ICD-10-CM | POA: Diagnosis not present

## 2018-12-16 DIAGNOSIS — H5213 Myopia, bilateral: Secondary | ICD-10-CM | POA: Diagnosis not present

## 2019-03-03 ENCOUNTER — Other Ambulatory Visit: Payer: Self-pay | Admitting: Family Medicine

## 2019-03-03 DIAGNOSIS — G894 Chronic pain syndrome: Secondary | ICD-10-CM

## 2019-03-03 MED ORDER — OXYCODONE HCL 5 MG PO TABS: 5.0000 mg | ORAL_TABLET | Freq: Three times a day (TID) | ORAL | 0 refills | Status: DC | PRN

## 2019-03-03 NOTE — Telephone Encounter (Signed)
Requesting:Oxycodone Contract:10/15/2017 UDS:12/07/2018 Last Visit:12/07/2018 Next Visit:[id number] Last Refill:12/07/2018  Please Advise

## 2019-05-10 ENCOUNTER — Encounter: Payer: Self-pay | Admitting: Family Medicine

## 2019-05-11 MED ORDER — FLUCONAZOLE 150 MG PO TABS: ORAL_TABLET | ORAL | 0 refills | Status: DC

## 2019-05-13 ENCOUNTER — Other Ambulatory Visit: Payer: Self-pay | Admitting: Family Medicine

## 2019-05-13 DIAGNOSIS — G894 Chronic pain syndrome: Secondary | ICD-10-CM

## 2019-05-13 MED ORDER — OXYCODONE HCL 5 MG PO TABS: 5.0000 mg | ORAL_TABLET | Freq: Three times a day (TID) | ORAL | 0 refills | Status: DC | PRN

## 2019-07-16 IMAGING — US US ABDOMEN LIMITED
1 series · 14 of 25 positions shown · non-contrast
Comparison: None.

CLINICAL DATA: Elevated liver enzymes

EXAM:
ULTRASOUND ABDOMEN LIMITED RIGHT UPPER QUADRANT

[Series 1: us abdomen limited · 0.16mm/px · 14 of 56 slices shown]
[im 1/56]
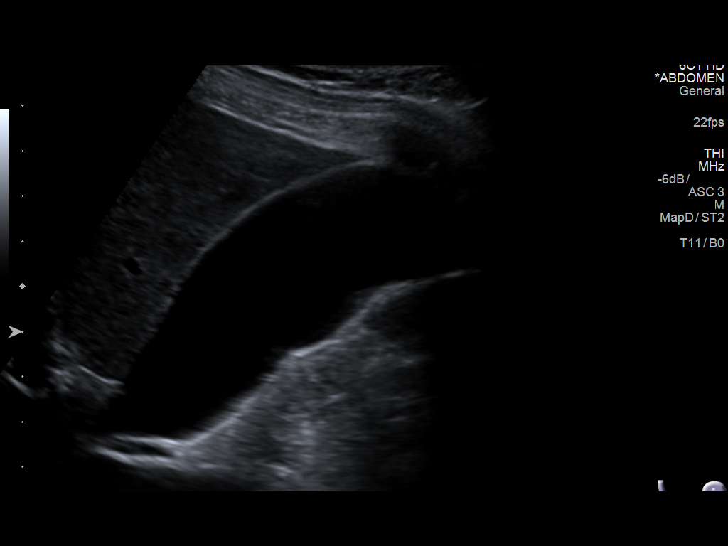
[im 5/56]
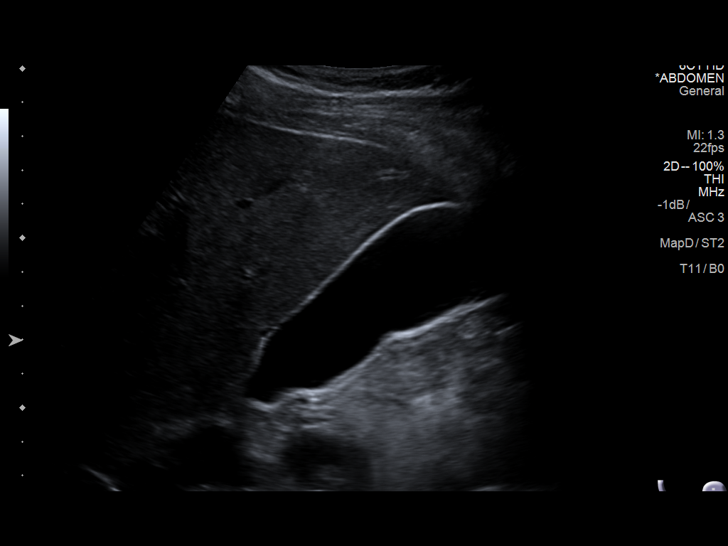
[im 10/56]
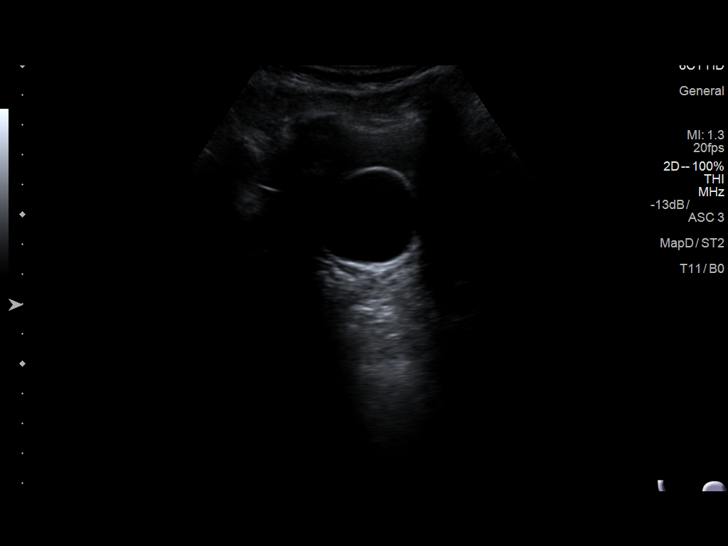
[im 14/56]
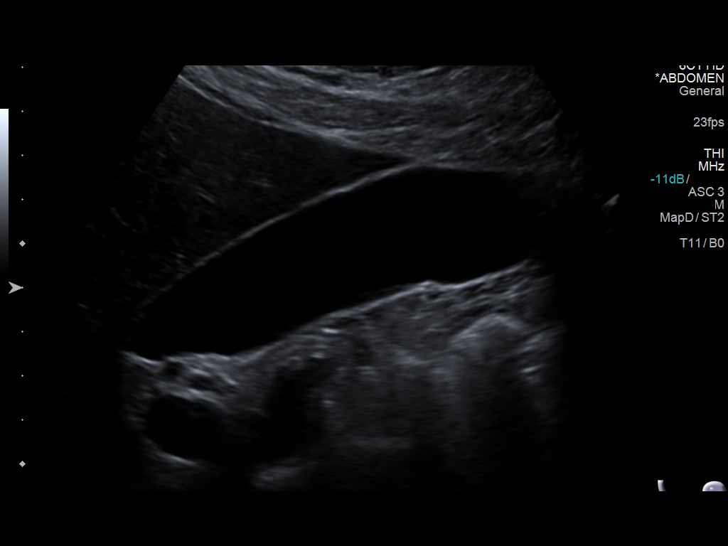
[im 19/56]
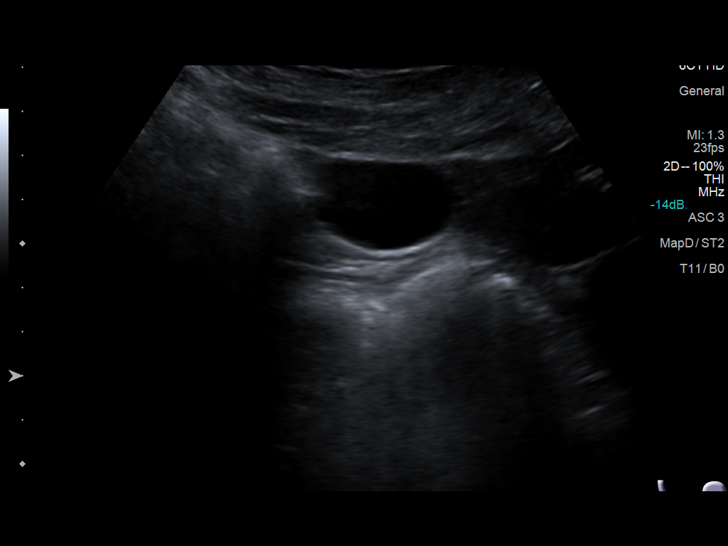
[im 21/56]
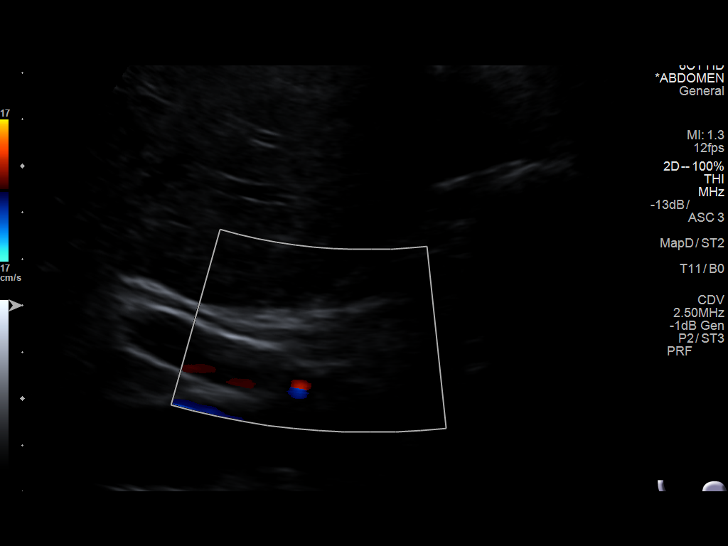
[im 26/56]
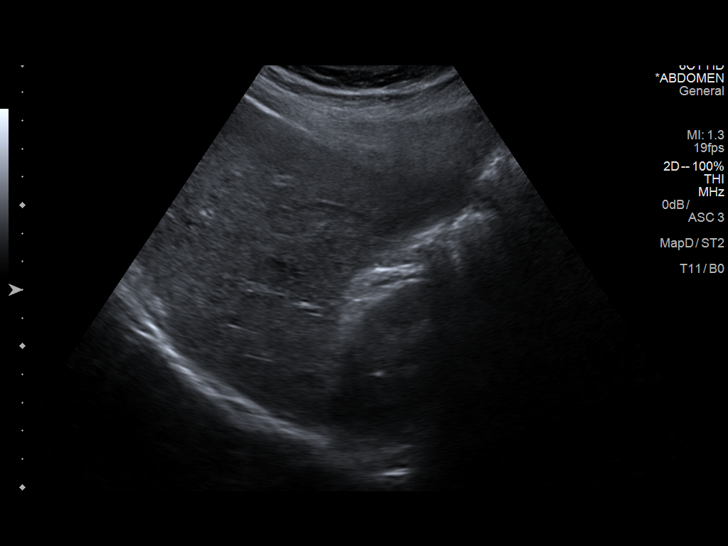
[im 30/56]
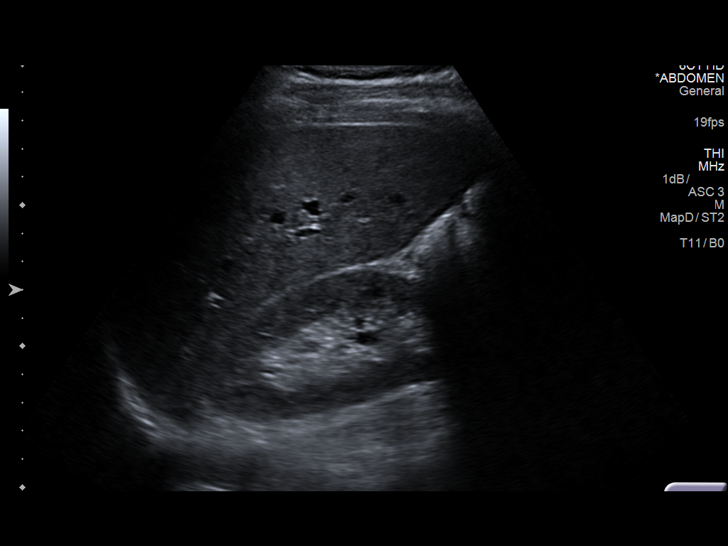
[im 35/56]
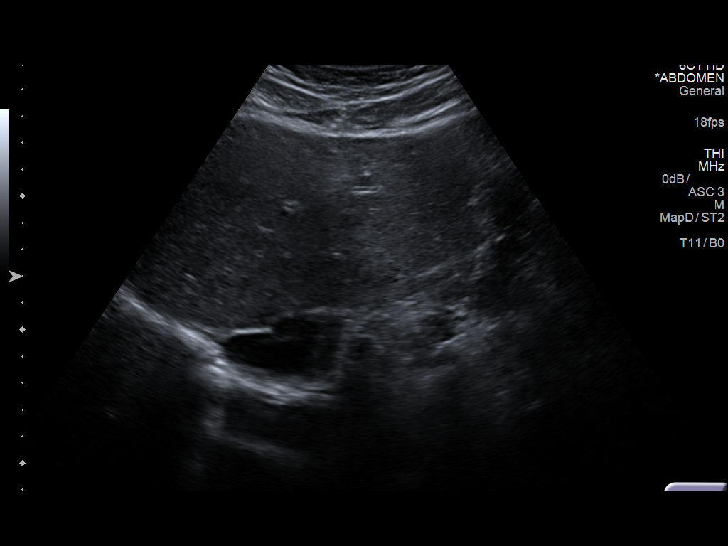
[im 37/56]
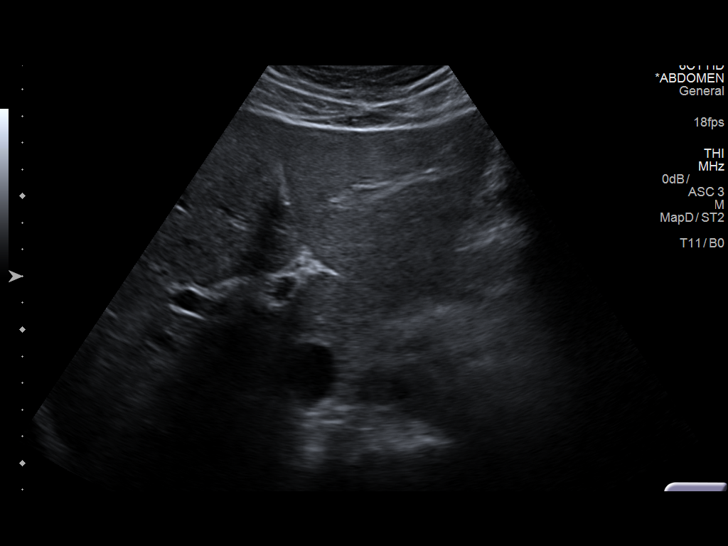
[im 42/56]
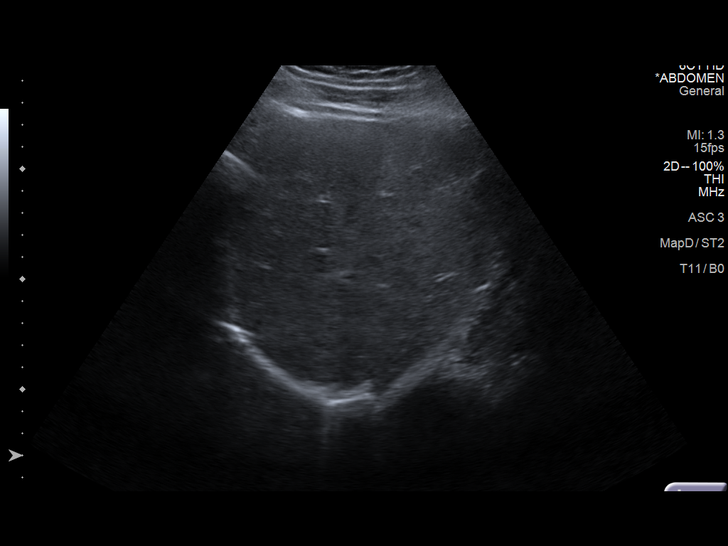
[im 46/56]
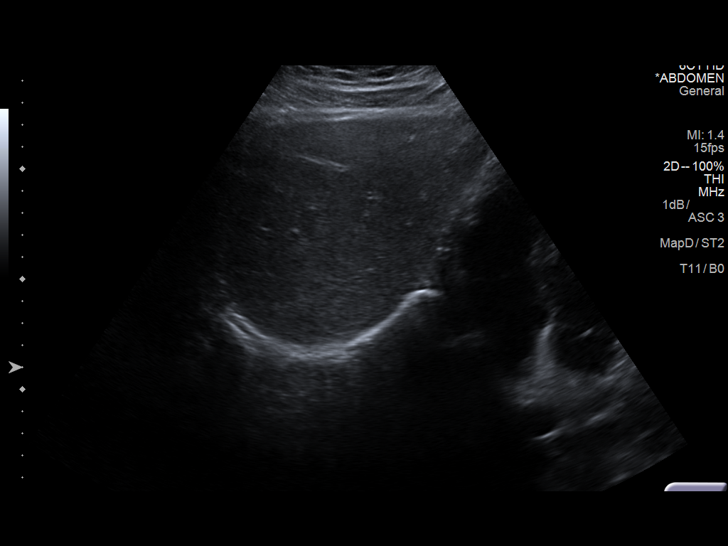
[im 51/56]
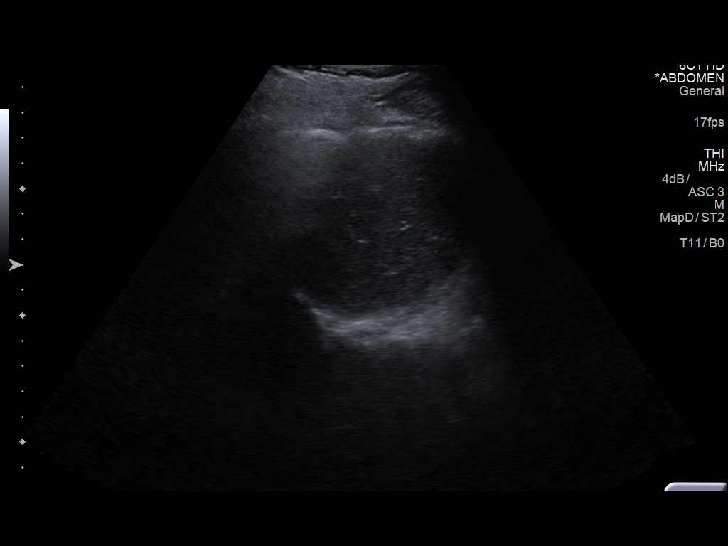
[im 56/56]
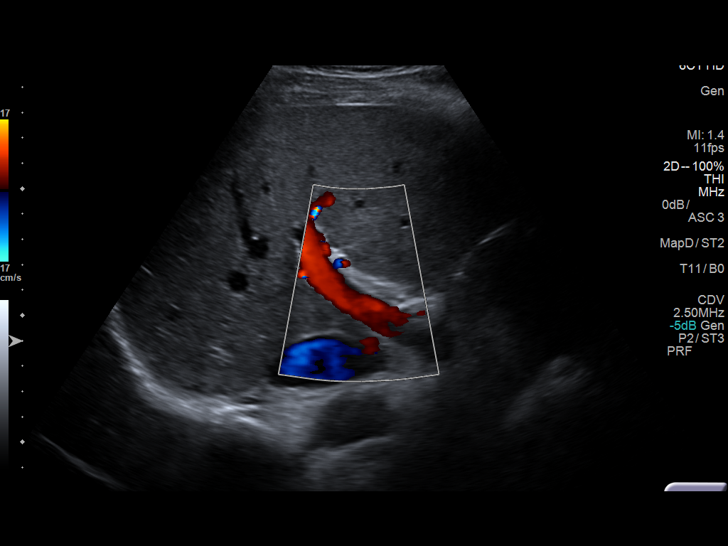

[14 of 25 positions shown; findings below may reference images not displayed]

FINDINGS: Gallbladder:

No gallstones or wall thickening visualized. There is no
pericholecystic fluid. No sonographic Murphy sign noted by
sonographer.

Common bile duct:

Diameter: 3 mm. No intrahepatic or extrahepatic biliary duct
dilatation.

Liver:

No focal lesion identified. Within normal limits in parenchymal
echogenicity. Portal vein is patent on color Doppler imaging with
normal direction of blood flow towards the liver.
IMPRESSION: Study within normal limits.

## 2019-07-23 ENCOUNTER — Encounter: Payer: Self-pay | Admitting: Family Medicine

## 2019-07-23 ENCOUNTER — Other Ambulatory Visit: Payer: Self-pay | Admitting: Family Medicine

## 2019-07-23 DIAGNOSIS — G894 Chronic pain syndrome: Secondary | ICD-10-CM

## 2019-07-23 MED ORDER — OXYCODONE HCL 5 MG PO TABS: 5.0000 mg | ORAL_TABLET | Freq: Three times a day (TID) | ORAL | 0 refills | Status: DC | PRN

## 2019-09-05 ENCOUNTER — Other Ambulatory Visit: Payer: Self-pay | Admitting: Family Medicine

## 2019-09-05 DIAGNOSIS — R1110 Vomiting, unspecified: Secondary | ICD-10-CM

## 2019-10-08 ENCOUNTER — Other Ambulatory Visit: Payer: Self-pay | Admitting: Family Medicine

## 2019-10-08 DIAGNOSIS — G894 Chronic pain syndrome: Secondary | ICD-10-CM

## 2019-10-08 MED ORDER — OXYCODONE HCL 5 MG PO TABS: 5.0000 mg | ORAL_TABLET | Freq: Three times a day (TID) | ORAL | 0 refills | Status: DC | PRN

## 2019-10-08 NOTE — Telephone Encounter (Signed)
Requesting:Oxycodone Contract: none UDS: 12/07/2018 Last Visit:12/07/2018 Next Visit:12/08/2019 Last Refill:07/23/2019  Please Advise

## 2019-11-03 IMAGING — DX DG ABDOMEN 2V
3 series · 3 of 3 positions shown · non-contrast
Comparison: One-view abdomen 01/25/2015

CLINICAL DATA: Abdominal distension. Left upper quadrant abdominal
pain.

EXAM:
ABDOMEN - 2 VIEW

[abdomen erect]
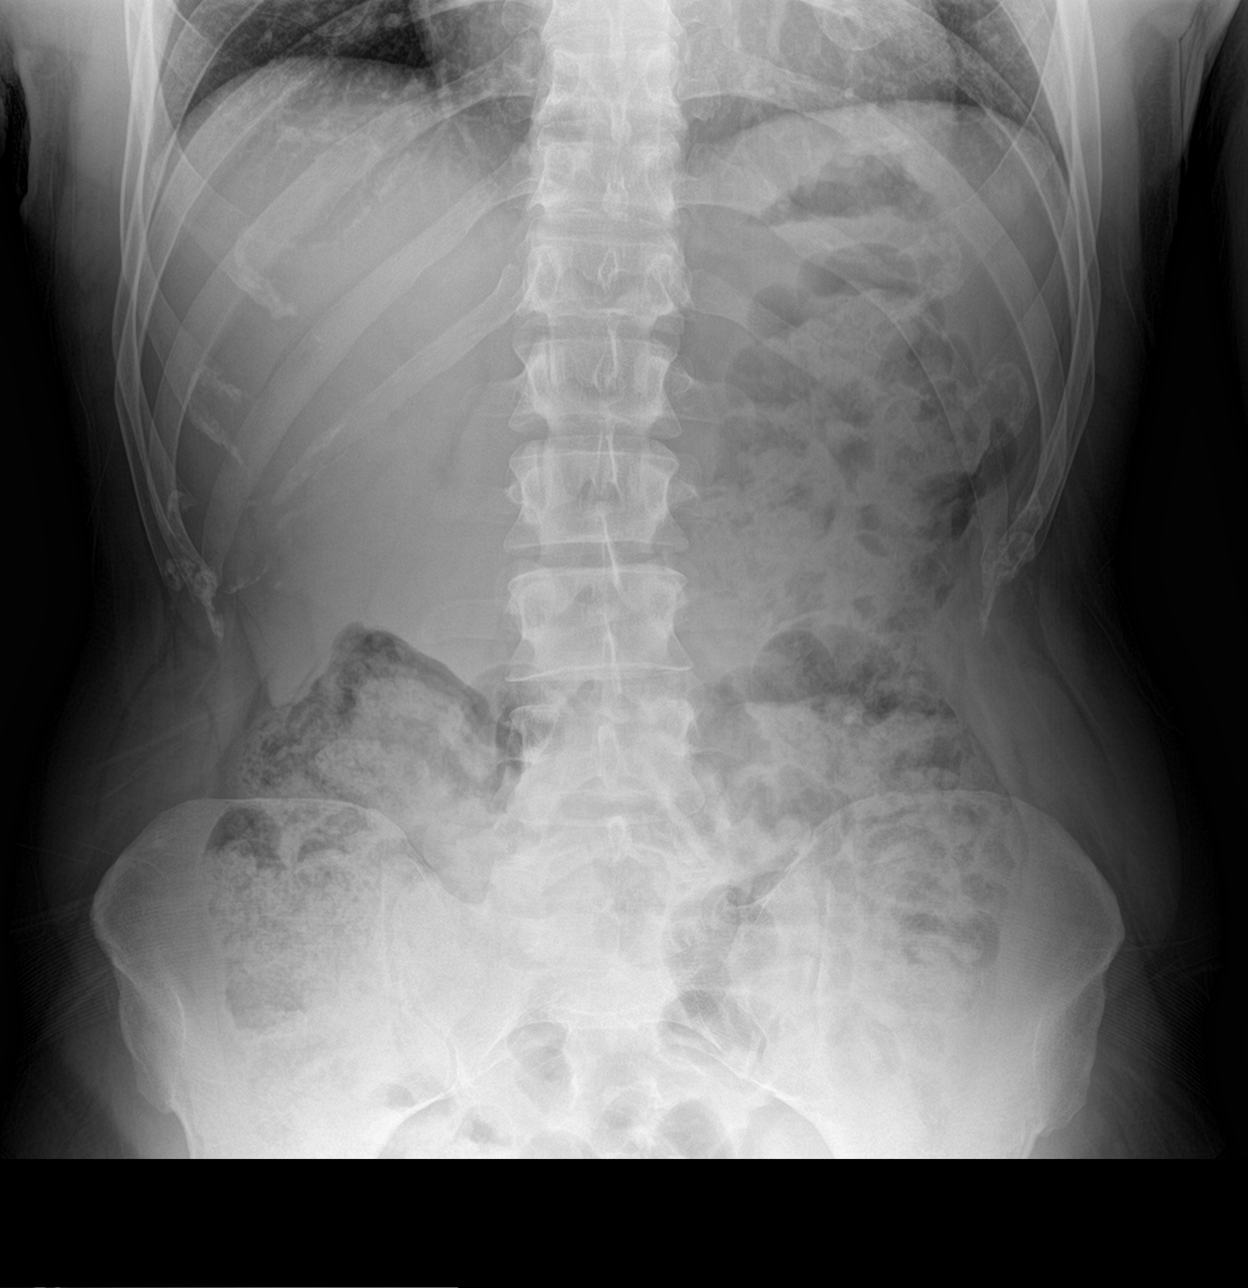

[abdomen supine (1 of 2)]
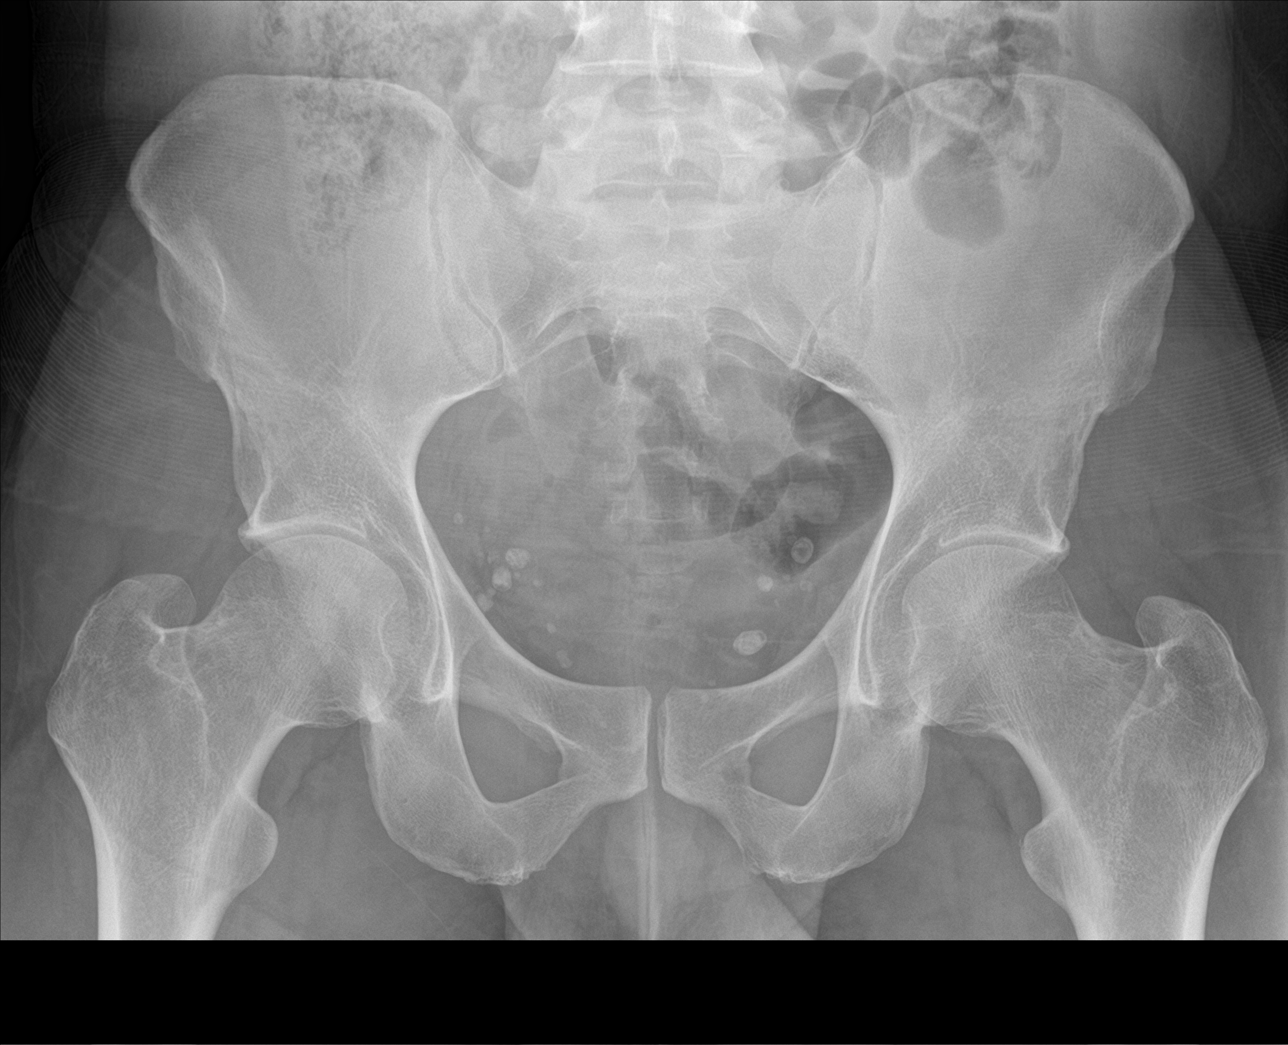

[abdomen supine (2 of 2)]
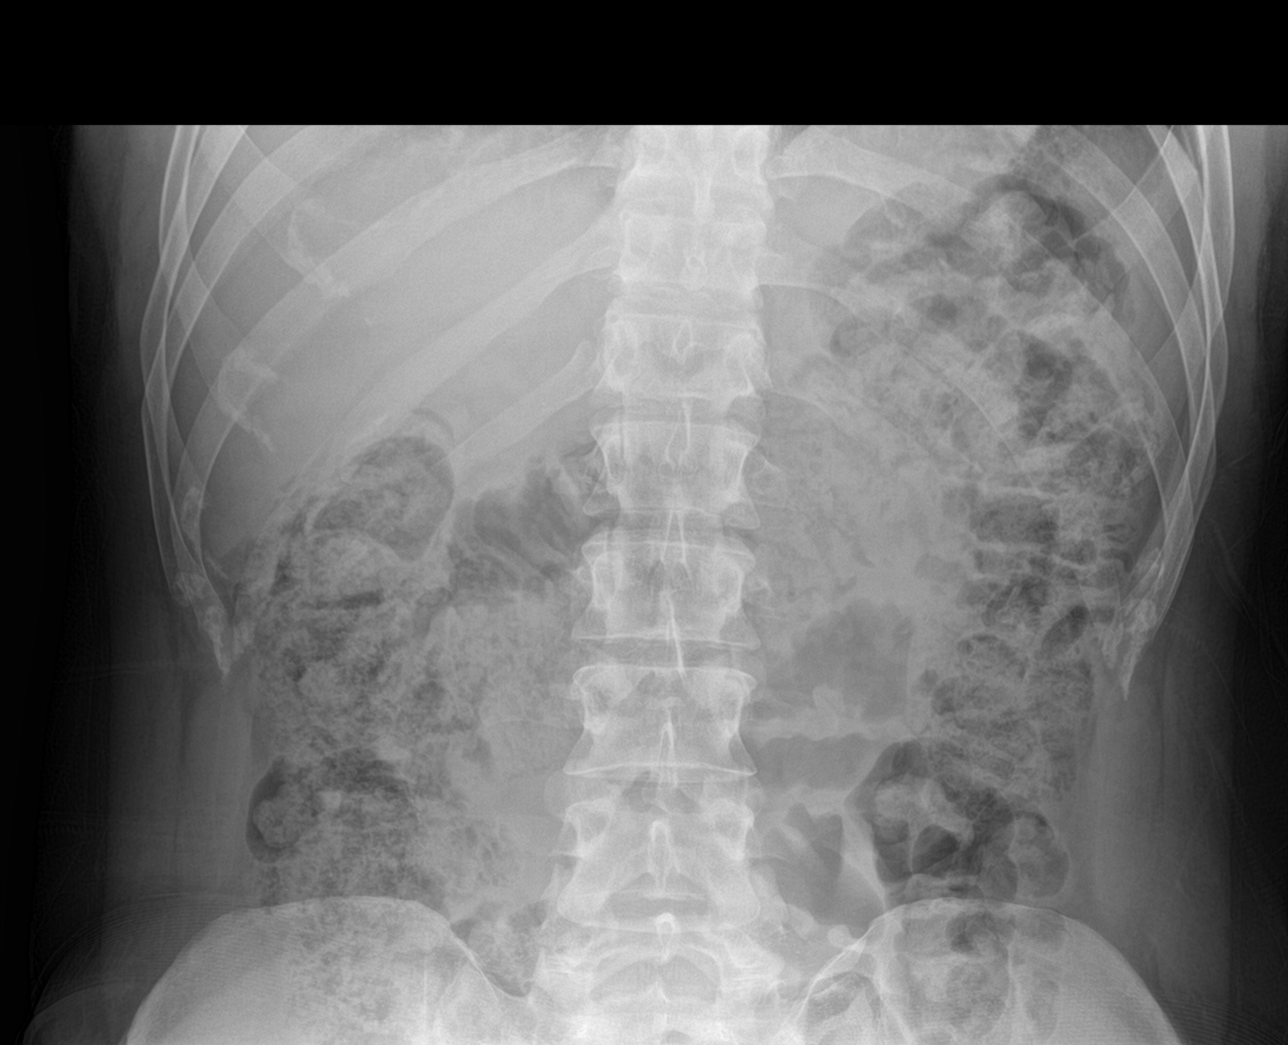

[3 of 3 positions shown; findings below may reference images not displayed]

FINDINGS: Moderate stool is present throughout the colon. There is no
obstruction. Small bowel gas pattern is normal. The lung bases are
clear.
IMPRESSION: 1. Moderate stool throughout the colon without evidence for
obstruction.

## 2019-11-05 ENCOUNTER — Other Ambulatory Visit: Payer: Self-pay | Admitting: Family Medicine

## 2019-11-05 DIAGNOSIS — G894 Chronic pain syndrome: Secondary | ICD-10-CM

## 2019-11-07 ENCOUNTER — Encounter: Payer: Self-pay | Admitting: Family Medicine

## 2019-11-07 MED ORDER — OXYCODONE HCL 5 MG PO TABS: 5.0000 mg | ORAL_TABLET | Freq: Three times a day (TID) | ORAL | 0 refills | Status: DC | PRN

## 2019-12-05 NOTE — Progress Notes (Addendum)
[facility] [address] [place], [place] [address] [phone]  Date:  12/08/2019   Name:  Douglas Nelson   DOB:  1985-03-15   MRN:  [id number]  PCP:  Pearline Cables, MD    Chief Complaint: Annual Exam (flu shot)   History of Present Illness:  Douglas M Hobby is a 34 y.o. 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  He is now status post subtalar fusion with iliac crest bone graft.  He does use a cane, uses oxycodone for chronic pain.  It is about the same - stable but painful  Last seen by myself about 1 year ago Married to Cameron, they have 3 children ages 29, 45, 8.  The oldest is a senior this year- he is planning to play football in college and he is a Scientist, research (physical sciences).  He works as a Museum/gallery conservator- done, he had moderna series-he will be alert to recs regarding booster  Flu shot- done today Routine labs- he is fasting today  Update today, hep C Noted elevated bilirubin previously, fractionate today Needs UDS  He does ride his exercise bike and does upper body exercises at home No CP or SOB   11/07/2019  1   11/07/2019  Oxycodone Hcl 5 MG Tablet  30.00  10 Je Cop   [id number]   [facility] ([id number])   0/0  22.50 MME  Medicare   [place]  10/08/2019  1   10/08/2019  Oxycodone Hcl 5 MG Tablet  30.00  10 Je Cop   [id number]   [facility] ([id number])   0/0  22.50 MME  Medicare   [place]  07/23/2019  1   07/23/2019  Oxycodone Hcl 5 MG Tablet  90.00  30 Je Cop   [id number]   [facility] ([id number])   0/0  22.50 MME  Medicare   [place]  05/13/2019  1   05/13/2019  Oxycodone Hcl 5 MG Tablet  90.00  30 Je Cop   [id number]   [facility] ([id number])   0/0  22.50 MME  Medicare   [place]  03/03/2019  1   03/03/2019  Oxycodone Hcl 5 MG Tablet  90.00  30 Je Cop   [id number]   [facility] ([id number])   0/0  22.50 MME  Medicare   [place]  12/26/2018  1   12/07/2018  Oxycodone Hcl  5 MG Tablet  90.00  30 Je Cop   [id number]   [facility] ([id number])   0/0  22.50 MME  Medicare   [place]  10/06/2018  1   10/05/2018  Oxycodone Hcl 5 MG Tablet  90.00  30 Je Cop   [id number]   [facility] ([id number])   0/0  22.50 MME  Medicare   [place]  07/30/2018  1   07/29/2018  Oxycodone Hcl 5 MG Tablet  90.00  30 Je Cop   [id number]   [facility] ([id number])   0/0  22.50 MME      Patient Active Problem List   Diagnosis Date Noted  . Left ankle pain 04/15/2016  . Hematuria 01/25/2015  . Left foot pain 12/16/2013  . Overweight (BMI 25.0-29.9) 11/08/2013  . Coalition, talocalcaneal 09/16/2011  . Allergic rhinitis, seasonal 06/14/2011  . S/P foot surgery, left 04/04/2011    Past Medical History:  Diagnosis Date  . Chronic  foot pain     Past Surgical History:  Procedure Laterality Date  . SUBTALAR DISTRACTION BONE BLOCK FUSION WITH ILIAC CREST BONE GRAFT Left     Social History   Tobacco Use  . Smoking status: Former Smoker    Types: Cigarettes    Quit date: 08/30/2010    Years since quitting: 9.2  . Smokeless tobacco: Never Used  Substance Use Topics  . Alcohol use: Not on file    Comment: occasionally  . Drug use: No    Family History  Problem Relation Age of Onset  . Hypertension Maternal Grandmother   . Hypertension Maternal Grandfather   . Anuerysm Father   . Hypertension Father   . Cancer Mother        Ovarian    No Known Allergies  Medication list has been reviewed and updated.  Current Outpatient Medications on File Prior to Visit  Medication Sig Dispense Refill  . b complex vitamins capsule Take 1 capsule by mouth daily.    . cetirizine (ZYRTEC) 10 MG tablet Take 1 tablet (10 mg total) by mouth daily. (Patient taking differently: Take 10 mg by mouth daily. Pt alternates between Claritin and Zyrtec every 6 months.) 90 tablet 1  . Multiple Vitamin (MULTIVITAMIN) tablet Take 1 tablet by mouth daily.    Marland Kitchen oxyCODONE (OXY IR/ROXICODONE) 5 MG immediate release tablet Take 1 tablet (5 mg total) by mouth every 8  (eight) hours as needed. Time for 6 month visit please- no further refills until 30 tablet 0  . Probiotic Product (PROBIOTIC ADVANCED PO) Take by mouth.    . zinc gluconate 50 MG tablet Take 50 mg by mouth daily.     No current facility-administered medications on file prior to visit.    Review of Systems:  As per HPI- otherwise negative.    Physical Examination: Vitals:   12/08/19 0956  BP: 122/70  Pulse: 73  Resp: 16  SpO2: 95%   Vitals:   12/08/19 0956  Weight: 217 lb (98.4 kg)  Height: 6' (1.829 m)   Body mass index is 29.43 kg/m. Ideal Body Weight: Weight in (lb) to have BMI = 25: 183.9  GEN: no acute distress.  Looks well, normal weight  HEENT: Atraumatic, Normocephalic.   Bilateral TM wnl, oropharynx normal.  PEERL,EOMI.   Ears and Nose: No external deformity. CV: RRR, No M/G/R. No JVD. No thrill. No extra heart sounds. PULM: CTA B, no wheezes, crackles, rhonchi. No retractions. No resp. distress. No accessory muscle use. ABD: S, NT, ND, +BS. No rebound. No HSM. EXTR: No c/c/e PSYCH: Normally interactive. Conversant.  Left ankle fusion, uses cane chronically    Assessment and Plan: Physical exam  Screening for diabetes mellitus - Plan: Comprehensive metabolic panel, Hemoglobin A1c  Screening for hyperlipidemia - Plan: Lipid panel  Screening for deficiency anemia - Plan: CBC  S/P foot surgery, left  Chronic pain syndrome - Plan: DRUG MONITORING, PANEL 8 WITH CONFIRMATION, URINE, oxyCODONE (OXY IR/ROXICODONE) 5 MG immediate release tablet  Hyperbilirubinemia - Plan: Bilirubin, fractionated(tot/dir/indir)  Encounter for hepatitis C screening test for low risk patient - Plan: Hepatitis C antibody  Needs flu shot - Plan: Flu Vaccine QUAD 6+ mos PF IM (Fluarix Quad PF)  Here today for CPE Generally healthy except for chronic left ankle pain Refilled pain medication Routine labs pending as above Flu shot given .Will plan further follow- up pending  labs. He is considering revisiting operation options (ankle joint replacement) though [facility]  This  visit occurred during the SARS-CoV-2 public health emergency.  Safety protocols were in place, including screening questions prior to the visit, additional usage of staff PPE, and extensive cleaning of exam room while observing appropriate contact time as indicated for disinfecting solutions.    Signed Abbe Amsterdam, MD  Addendum 9/30, received his labs as below-message to patient  Results for orders placed or performed in visit on 12/08/19  CBC  Result Value Ref Range   WBC 4.4 3.8 - 10.8 Thousand/uL   RBC 5.24 4.20 - 5.80 Million/uL   Hemoglobin 15.7 13.2 - 17.1 g/dL   HCT 05.1 38 - 50 %   MCV 86.8 80.0 - 100.0 fL   MCH 30.0 27.0 - 33.0 pg   MCHC 34.5 32.0 - 36.0 g/dL   RDW 10.2 11.1 - 73.5 %   Platelets 184 140 - 400 Thousand/uL   MPV 13.2 (H) 7.5 - 12.5 fL  Comprehensive metabolic panel  Result Value Ref Range   Glucose, Bld 82 65 - 99 mg/dL   BUN 7 7 - 25 mg/dL   Creat 6.70 1.41 - 0.30 mg/dL   BUN/Creatinine Ratio NOT APPLICABLE 6 - 22 (calc)   Sodium 140 135 - 146 mmol/L   Potassium 4.5 3.5 - 5.3 mmol/L   Chloride 101 98 - 110 mmol/L   CO2 27 20 - 32 mmol/L   Calcium 9.8 8.6 - 10.3 mg/dL   Total Protein 7.0 6.1 - 8.1 g/dL   Albumin 4.7 3.6 - 5.1 g/dL   Globulin 2.3 1.9 - 3.7 g/dL (calc)   AG Ratio 2.0 1.0 - 2.5 (calc)   Total Bilirubin 1.5 (H) 0.2 - 1.2 mg/dL   Alkaline phosphatase (APISO) 66 36 - 130 U/L   AST 35 10 - 40 U/L   ALT 15 9 - 46 U/L  Hemoglobin A1c  Result Value Ref Range   Hgb A1c MFr Bld 5.3 <5.7 % of total Hgb   Mean Plasma Glucose 105 (calc)   eAG (mmol/L) 5.8 (calc)  Lipid panel  Result Value Ref Range   Cholesterol 229 (H) <200 mg/dL   HDL 70 > OR = 40 mg/dL   Triglycerides 94 <131 mg/dL   LDL Cholesterol (Calc) 139 (H) mg/dL (calc)   Total CHOL/HDL Ratio 3.3 <5.0 (calc)   Non-HDL Cholesterol (Calc) 159 (H) <130 mg/dL (calc)  Bilirubin,  fractionated(tot/dir/indir)  Result Value Ref Range   Total Bilirubin 1.5 (H) 0.2 - 1.2 mg/dL   Bilirubin, Direct 0.2 0.0 - 0.2 mg/dL   Indirect Bilirubin 1.3 (H) 0.2 - 1.2 mg/dL (calc)

## 2019-12-05 NOTE — Patient Instructions (Addendum)
It was great to see you again today, I will be in touch your labs soon as possible  Please see me in about 6 months for medication follow-up assuming all is well  I will refill your pain medication   Health Maintenance, Male Adopting a healthy lifestyle and getting preventive care are important in promoting health and wellness. Ask your health care provider about:  The right schedule for you to have regular tests and exams.  Things you can do on your own to prevent diseases and keep yourself healthy. What should I know about diet, weight, and exercise? Eat a healthy diet   Eat a diet that includes plenty of vegetables, fruits, low-fat dairy products, and lean protein.  Do not eat a lot of foods that are high in solid fats, added sugars, or sodium. Maintain a healthy weight Body mass index (BMI) is a measurement that can be used to identify possible weight problems. It estimates body fat based on height and weight. Your health care provider can help determine your BMI and help you achieve or maintain a healthy weight. Get regular exercise Get regular exercise. This is one of the most important things you can do for your health. Most adults should:  Exercise for at least 150 minutes each week. The exercise should increase your heart rate and make you sweat (moderate-intensity exercise).  Do strengthening exercises at least twice a week. This is in addition to the moderate-intensity exercise.  Spend less time sitting. Even light physical activity can be beneficial. Watch cholesterol and blood lipids Have your blood tested for lipids and cholesterol at 33 years of age, then have this test every 5 years. You may need to have your cholesterol levels checked more often if:  Your lipid or cholesterol levels are high.  You are older than 33 years of age.  You are at high risk for heart disease. What should I know about cancer screening? Many types of cancers can be detected early and  may often be prevented. Depending on your health history and family history, you may need to have cancer screening at various ages. This may include screening for:  Colorectal cancer.  Prostate cancer.  Skin cancer.  Lung cancer. What should I know about heart disease, diabetes, and high blood pressure? Blood pressure and heart disease  High blood pressure causes heart disease and increases the risk of stroke. This is more likely to develop in people who have high blood pressure readings, are of African descent, or are overweight.  Talk with your health care provider about your target blood pressure readings.  Have your blood pressure checked: ? Every 3-5 years if you are 59-3 years of age. ? Every year if you are 19 years old or older.  If you are between the ages of 63 and 57 and are a current or former smoker, ask your health care provider if you should have a one-time screening for abdominal aortic aneurysm (AAA). Diabetes Have regular diabetes screenings. This checks your fasting blood sugar level. Have the screening done:  Once every three years after age 21 if you are at a normal weight and have a low risk for diabetes.  More often and at a younger age if you are overweight or have a high risk for diabetes. What should I know about preventing infection? Hepatitis B If you have a higher risk for hepatitis B, you should be screened for this virus. Talk with your health care provider to find out if  you are at risk for hepatitis B infection. Hepatitis C Blood testing is recommended for:  Everyone born from [birth year] through 1964.  Anyone with known risk factors for hepatitis C. Sexually transmitted infections (STIs)  You should be screened each year for STIs, including gonorrhea and chlamydia, if: ? You are sexually active and are younger than 33 years of age. ? You are older than 33 years of age and your health care provider tells you that you are at risk for this type of  infection. ? Your sexual activity has changed since you were last screened, and you are at increased risk for chlamydia or gonorrhea. Ask your health care provider if you are at risk.  Ask your health care provider about whether you are at high risk for HIV. Your health care provider may recommend a prescription medicine to help prevent HIV infection. If you choose to take medicine to prevent HIV, you should first get tested for HIV. You should then be tested every 3 months for as long as you are taking the medicine. Follow these instructions at home: Lifestyle  Do not use any products that contain nicotine or tobacco, such as cigarettes, e-cigarettes, and chewing tobacco. If you need help quitting, ask your health care provider.  Do not use street drugs.  Do not share needles.  Ask your health care provider for help if you need support or information about quitting drugs. Alcohol use  Do not drink alcohol if your health care provider tells you not to drink.  If you drink alcohol: ? Limit how much you have to 0-2 drinks a day. ? Be aware of how much alcohol is in your drink. In the [place] one drink equals one 12 oz bottle of beer (355 mL), one 5 oz glass of wine (148 mL), or one 1 oz glass of hard liquor (44 mL). General instructions  Schedule regular health, dental, and eye exams.  Stay current with your vaccines.  Tell your health care provider if: ? You often feel depressed. ? You have ever been abused or do not feel safe at home. Summary  Adopting a healthy lifestyle and getting preventive care are important in promoting health and wellness.  Follow your health care provider's instructions about healthy diet, exercising, and getting tested or screened for diseases.  Follow your health care provider's instructions on monitoring your cholesterol and blood pressure. This information is not intended to replace advice given to you by your health care provider. Make sure you  discuss any questions you have with your health care provider. Document Revised: 02/18/2018 Document Reviewed: 02/18/2018 Elsevier Patient Education  2020 [employer].

## 2019-12-06 DIAGNOSIS — Z1159 Encounter for screening for other viral diseases: Secondary | ICD-10-CM | POA: Diagnosis not present

## 2019-12-06 DIAGNOSIS — Z20822 Contact with and (suspected) exposure to covid-19: Secondary | ICD-10-CM | POA: Diagnosis not present

## 2019-12-08 ENCOUNTER — Ambulatory Visit (INDEPENDENT_AMBULATORY_CARE_PROVIDER_SITE_OTHER): Payer: Medicare HMO | Admitting: Family Medicine

## 2019-12-08 ENCOUNTER — Other Ambulatory Visit: Payer: Self-pay

## 2019-12-08 ENCOUNTER — Encounter: Payer: Self-pay | Admitting: Family Medicine

## 2019-12-08 VITALS — BP 122/70 | HR 73 | Resp 16 | Ht 72.0 in | Wt 217.0 lb

## 2019-12-08 DIAGNOSIS — Z13220 Encounter for screening for lipoid disorders: Secondary | ICD-10-CM | POA: Diagnosis not present

## 2019-12-08 DIAGNOSIS — Z0000 Encounter for general adult medical examination without abnormal findings: Secondary | ICD-10-CM | POA: Diagnosis not present

## 2019-12-08 DIAGNOSIS — Z1159 Encounter for screening for other viral diseases: Secondary | ICD-10-CM

## 2019-12-08 DIAGNOSIS — Z130 Encounter for screening for diseases of the blood and blood-forming organs and certain disorders involving the immune mechanism: Secondary | ICD-10-CM | POA: Diagnosis not present

## 2019-12-08 DIAGNOSIS — Z131 Encounter for screening for diabetes mellitus: Secondary | ICD-10-CM | POA: Diagnosis not present

## 2019-12-08 DIAGNOSIS — Z9889 Other specified postprocedural states: Secondary | ICD-10-CM | POA: Diagnosis not present

## 2019-12-08 DIAGNOSIS — G894 Chronic pain syndrome: Secondary | ICD-10-CM

## 2019-12-08 DIAGNOSIS — Z23 Encounter for immunization: Secondary | ICD-10-CM

## 2019-12-08 MED ORDER — OXYCODONE HCL 5 MG PO TABS: 5.0000 mg | ORAL_TABLET | Freq: Three times a day (TID) | ORAL | 0 refills | Status: DC | PRN

## 2019-12-09 ENCOUNTER — Encounter: Payer: Self-pay | Admitting: Family Medicine

## 2019-12-09 LAB — COMPREHENSIVE METABOLIC PANEL
AG Ratio: 2 (calc) (ref 1.0–2.5)
ALT: 15 U/L (ref 9–46)
AST: 35 U/L (ref 10–40)
Albumin: 4.7 g/dL (ref 3.6–5.1)
Alkaline phosphatase (APISO): 66 U/L (ref 36–130)
BUN: 7 mg/dL (ref 7–25)
CO2: 27 mmol/L (ref 20–32)
Calcium: 9.8 mg/dL (ref 8.6–10.3)
Chloride: 101 mmol/L (ref 98–110)
Creat: 0.87 mg/dL (ref 0.60–1.35)
Globulin: 2.3 g/dL (calc) (ref 1.9–3.7)
Glucose, Bld: 82 mg/dL (ref 65–99)
Potassium: 4.5 mmol/L (ref 3.5–5.3)
Sodium: 140 mmol/L (ref 135–146)
Total Bilirubin: 1.5 mg/dL — ABNORMAL HIGH (ref 0.2–1.2)
Total Protein: 7 g/dL (ref 6.1–8.1)

## 2019-12-09 LAB — HEMOGLOBIN A1C
Hgb A1c MFr Bld: 5.3 % of total Hgb (ref <5.7)
Mean Plasma Glucose: 105 (calc)
eAG (mmol/L): 5.8 (calc)

## 2019-12-09 LAB — CBC
HCT: 45.5 % (ref 38.5–50.0)
Hemoglobin: 15.7 g/dL (ref 13.2–17.1)
MCH: 30 pg (ref 27.0–33.0)
MCHC: 34.5 g/dL (ref 32.0–36.0)
MCV: 86.8 fL (ref 80.0–100.0)
MPV: 13.2 fL — ABNORMAL HIGH (ref 7.5–12.5)
Platelets: 184 10*3/uL (ref 140–400)
RBC: 5.24 10*6/uL (ref 4.20–5.80)
RDW: 12.4 % (ref 11.0–15.0)
WBC: 4.4 10*3/uL (ref 3.8–10.8)

## 2019-12-09 LAB — HEPATITIS C ANTIBODY
Hepatitis C Ab: NONREACTIVE
SIGNAL TO CUT-OFF: 0.01 (ref <1.00)

## 2019-12-09 LAB — LIPID PANEL
Cholesterol: 229 mg/dL — ABNORMAL HIGH (ref <200)
HDL: 70 mg/dL (ref >=40)
LDL Cholesterol (Calc): 139 mg/dL (calc) — ABNORMAL HIGH
Non-HDL Cholesterol (Calc): 159 mg/dL (calc) — ABNORMAL HIGH (ref <130)
Total CHOL/HDL Ratio: 3.3 (calc) (ref <5.0)
Triglycerides: 94 mg/dL (ref <150)

## 2019-12-09 LAB — BILIRUBIN, FRACTIONATED(TOT/DIR/INDIR)
Bilirubin, Direct: 0.2 mg/dL (ref 0.0–0.2)
Indirect Bilirubin: 1.3 mg/dL (calc) — ABNORMAL HIGH (ref 0.2–1.2)
Total Bilirubin: 1.5 mg/dL — ABNORMAL HIGH (ref 0.2–1.2)

## 2019-12-11 LAB — DRUG MONITORING, PANEL 8 WITH CONFIRMATION, URINE
6 Acetylmorphine: NEGATIVE ng/mL (ref <10)
Alcohol Metabolites: NEGATIVE ng/mL
Amphetamines: NEGATIVE ng/mL (ref <500)
Benzodiazepines: NEGATIVE ng/mL (ref <100)
Buprenorphine, Urine: NEGATIVE ng/mL (ref <5)
Cocaine Metabolite: NEGATIVE ng/mL (ref <150)
Creatinine: 31.3 mg/dL
MDMA: NEGATIVE ng/mL (ref <500)
Marijuana Metabolite: NEGATIVE ng/mL (ref <20)
Noroxycodone: 92 ng/mL — ABNORMAL HIGH (ref <50)
Opiates: NEGATIVE ng/mL (ref <100)
Oxidant: NEGATIVE ug/mL
Oxycodone: NEGATIVE ng/mL (ref <50)
Oxycodone: POSITIVE ng/mL — AB (ref <100)
Oxymorphone: NEGATIVE ng/mL (ref <50)
pH: 8.1 (ref 4.5–9.0)

## 2019-12-11 LAB — DM TEMPLATE

## 2020-01-05 ENCOUNTER — Other Ambulatory Visit: Payer: Self-pay | Admitting: Family Medicine

## 2020-01-05 DIAGNOSIS — G894 Chronic pain syndrome: Secondary | ICD-10-CM

## 2020-01-06 MED ORDER — OXYCODONE HCL 5 MG PO TABS
5.0000 mg | ORAL_TABLET | Freq: Three times a day (TID) | ORAL | 0 refills | Status: DC | PRN
Start: 2020-01-06 — End: 2020-02-07

## 2020-01-06 NOTE — Telephone Encounter (Signed)
Last written: 12/08/19 Last ov: 12/08/19 Next ov: 12/11/20 Contract: 10/08/17 UDS: 12/08/19

## 2020-02-07 ENCOUNTER — Other Ambulatory Visit: Payer: Self-pay | Admitting: Family Medicine

## 2020-02-07 DIAGNOSIS — G894 Chronic pain syndrome: Secondary | ICD-10-CM

## 2020-02-07 MED ORDER — OXYCODONE HCL 5 MG PO TABS
5.0000 mg | ORAL_TABLET | Freq: Three times a day (TID) | ORAL | 0 refills | Status: DC | PRN
Start: 2020-02-07 — End: 2020-03-08

## 2020-02-07 NOTE — Telephone Encounter (Signed)
Last written: 01/06/20 Last ov: 12/08/19 Next ov: 12/11/20 Contract: 10/08/17 UDS:12/08/19

## 2020-03-08 ENCOUNTER — Other Ambulatory Visit: Payer: Self-pay | Admitting: Family Medicine

## 2020-03-08 DIAGNOSIS — G894 Chronic pain syndrome: Secondary | ICD-10-CM

## 2020-03-08 MED ORDER — OXYCODONE HCL 5 MG PO TABS
5.0000 mg | ORAL_TABLET | Freq: Three times a day (TID) | ORAL | 0 refills | Status: DC | PRN
Start: 2020-03-08 — End: 2020-04-06

## 2020-03-08 NOTE — Telephone Encounter (Signed)
Requesting: oxycodone 5mg  Contract: None UDS: None Last Visit: 12/08/2019 Next Visit: 12/11/2020 Last Refill:02/07/2020 #30 and 0RF   Please Advise

## 2020-04-06 ENCOUNTER — Other Ambulatory Visit: Payer: Self-pay | Admitting: Family Medicine

## 2020-04-06 DIAGNOSIS — G894 Chronic pain syndrome: Secondary | ICD-10-CM

## 2020-04-07 MED ORDER — OXYCODONE HCL 5 MG PO TABS: 5.0000 mg | ORAL_TABLET | Freq: Three times a day (TID) | ORAL | 0 refills | Status: DC | PRN

## 2020-05-15 ENCOUNTER — Other Ambulatory Visit: Payer: Self-pay | Admitting: Family Medicine

## 2020-05-15 DIAGNOSIS — G894 Chronic pain syndrome: Secondary | ICD-10-CM

## 2020-05-16 ENCOUNTER — Encounter: Payer: Self-pay | Admitting: Family Medicine

## 2020-05-16 MED ORDER — OXYCODONE HCL 5 MG PO TABS: 5.0000 mg | ORAL_TABLET | Freq: Three times a day (TID) | ORAL | 0 refills | Status: DC | PRN

## 2020-06-28 ENCOUNTER — Other Ambulatory Visit (INDEPENDENT_AMBULATORY_CARE_PROVIDER_SITE_OTHER): Payer: Medicare HMO

## 2020-06-28 ENCOUNTER — Encounter: Payer: Self-pay | Admitting: Family Medicine

## 2020-06-28 ENCOUNTER — Other Ambulatory Visit: Payer: Self-pay | Admitting: *Deleted

## 2020-06-28 ENCOUNTER — Other Ambulatory Visit: Payer: Self-pay | Admitting: Family Medicine

## 2020-06-28 DIAGNOSIS — G894 Chronic pain syndrome: Secondary | ICD-10-CM | POA: Diagnosis not present

## 2020-06-28 DIAGNOSIS — Z79899 Other long term (current) drug therapy: Secondary | ICD-10-CM

## 2020-06-28 MED ORDER — OXYCODONE HCL 5 MG PO TABS: 5.0000 mg | ORAL_TABLET | Freq: Three times a day (TID) | ORAL | 0 refills | Status: DC | PRN

## 2020-06-29 LAB — DRUG MONITORING, PANEL 8 WITH CONFIRMATION, URINE
6 Acetylmorphine: NEGATIVE ng/mL (ref <10)
Alcohol Metabolites: NEGATIVE ng/mL
Amphetamines: NEGATIVE ng/mL (ref <500)
Benzodiazepines: NEGATIVE ng/mL (ref <100)
Buprenorphine, Urine: NEGATIVE ng/mL (ref <5)
Cocaine Metabolite: NEGATIVE ng/mL (ref <150)
Creatinine: 50.8 mg/dL
MDMA: NEGATIVE ng/mL (ref <500)
Marijuana Metabolite: NEGATIVE ng/mL (ref <20)
Opiates: NEGATIVE ng/mL (ref <100)
Oxidant: NEGATIVE ug/mL
Oxycodone: NEGATIVE ng/mL (ref <100)
pH: 6.8 (ref 4.5–9.0)

## 2020-06-29 LAB — DM TEMPLATE

## 2020-07-18 ENCOUNTER — Other Ambulatory Visit: Payer: Self-pay

## 2020-07-18 ENCOUNTER — Encounter: Payer: Self-pay | Admitting: Family Medicine

## 2020-07-18 ENCOUNTER — Telehealth (INDEPENDENT_AMBULATORY_CARE_PROVIDER_SITE_OTHER): Payer: Medicare HMO | Admitting: Family Medicine

## 2020-07-18 DIAGNOSIS — J0140 Acute pansinusitis, unspecified: Secondary | ICD-10-CM | POA: Diagnosis not present

## 2020-07-18 MED ORDER — DOXYCYCLINE HYCLATE 100 MG PO TABS: 100.0000 mg | ORAL_TABLET | Freq: Two times a day (BID) | ORAL | 0 refills | Status: AC

## 2020-07-18 MED ORDER — PREDNISONE 20 MG PO TABS: 40.0000 mg | ORAL_TABLET | Freq: Every day | ORAL | 0 refills | Status: AC

## 2020-07-18 NOTE — Progress Notes (Addendum)
Chief Complaint  Patient presents with  . Nasal Congestion    [place] M Kalmar here for URI complaints. Due to COVID-19 pandemic, we are interacting via web portal for an electronic face-to-face visit. I verified patient's ID using 2 identifiers. Patient agreed to proceed with visit via this method. Patient is at home, I am at home. Patient and I are present for visit.   Duration: 8 days  Associated symptoms: sinus congestion, sinus pain, rhinorrhea, itchy watery eyes, ear pain, sore throat, chest tightness and cough Denies: ear drainage, wheezing, shortness of breath, myalgia and fevers Treatment to date: Vitamins Sick contacts: No  Tested neg for covid x 3  Past Medical History:  Diagnosis Date  . Chronic foot pain    Objective No conversational dyspnea Age appropriate judgment and insight Nml affect and mood  Acute pansinusitis, recurrence not specified - Plan: predniSONE (DELTASONE) 20 MG tablet, doxycycline (VIBRA-TABS) 100 MG tablet  5 d pred burst, 40 mg/d. If no improvement in the next 2 d, will take 7 d course of doxy.  Continue to push fluids, practice good hand hygiene, cover mouth when coughing. F/u prn. If starting to experience fevers, shaking, or shortness of breath, seek immediate care. Pt voiced understanding and agreement to the plan.  Jilda Roche [place], DO 07/18/20 9:15 AM

## 2020-08-02 ENCOUNTER — Other Ambulatory Visit: Payer: Self-pay | Admitting: Family Medicine

## 2020-08-02 DIAGNOSIS — G894 Chronic pain syndrome: Secondary | ICD-10-CM

## 2020-08-02 MED ORDER — OXYCODONE HCL 5 MG PO TABS: 5.0000 mg | ORAL_TABLET | Freq: Three times a day (TID) | ORAL | 0 refills | Status: DC | PRN

## 2020-09-12 ENCOUNTER — Other Ambulatory Visit: Payer: Self-pay | Admitting: Family Medicine

## 2020-09-12 DIAGNOSIS — G894 Chronic pain syndrome: Secondary | ICD-10-CM

## 2020-09-12 MED ORDER — OXYCODONE HCL 5 MG PO TABS: 5.0000 mg | ORAL_TABLET | Freq: Three times a day (TID) | ORAL | 0 refills | Status: DC | PRN

## 2020-09-12 NOTE — Telephone Encounter (Signed)
Patient is requesting a refill of the following medications: Requested Prescriptions   Pending Prescriptions Disp Refills   oxyCODONE (OXY IR/ROXICODONE) 5 MG immediate release tablet 30 tablet 0    Sig: Take 1 tablet (5 mg total) by mouth every 8 (eight) hours as needed.    Date of patient request: 09/12/20 Last office visit: 07/18/20 Acute & 12/08/19 w/ [place] Date of last refill: 07/18/20 Last refill amount: 30 + 0 Follow up time period per chart: 12/11/20

## 2020-10-18 ENCOUNTER — Other Ambulatory Visit: Payer: Self-pay | Admitting: Family Medicine

## 2020-10-18 DIAGNOSIS — G894 Chronic pain syndrome: Secondary | ICD-10-CM

## 2020-10-18 MED ORDER — OXYCODONE HCL 5 MG PO TABS: 5.0000 mg | ORAL_TABLET | Freq: Three times a day (TID) | ORAL | 0 refills | Status: DC | PRN

## 2020-11-14 ENCOUNTER — Other Ambulatory Visit: Payer: Self-pay | Admitting: Family Medicine

## 2020-11-14 DIAGNOSIS — G894 Chronic pain syndrome: Secondary | ICD-10-CM

## 2020-11-15 MED ORDER — OXYCODONE HCL 5 MG PO TABS: 5.0000 mg | ORAL_TABLET | Freq: Three times a day (TID) | ORAL | 0 refills | Status: DC | PRN

## 2020-11-17 ENCOUNTER — Encounter: Payer: Self-pay | Admitting: Family Medicine

## 2020-11-23 ENCOUNTER — Encounter: Payer: Self-pay | Admitting: Family Medicine

## 2020-12-07 NOTE — Progress Notes (Addendum)
[facility] [address], [address] [place], [place] [address] [phone]  Date:  12/11/2020   Name:  Douglas Nelson   DOB:  05/17/1985   MRN:  [id number]  PCP:  Pearline Cables, MD    Chief Complaint: Annual Exam (Concerns/ questions: pt would like to see about having a lab drawn for blood type. He would like the oral med for Tinea again. /Flu shot today: yes/)   History of Present Illness:  Douglas Nelson is a 35 y.o. 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  He is now status post subtalar fusion with iliac crest bone graft.  He does use a cane, uses oxycodone for chronic pain.  It is about the same - stable but painful.  He feels like other joints might be getting a bit worse over the years due to compensating for the ankle.  He has tried to get his diet under better control and is getting exercise on a regular basis.    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   Left ankle pain 04/15/2016   Hematuria 01/25/2015   Left foot pain 12/16/2013   Overweight (BMI 25.0-29.9) 11/08/2013   Coalition, talocalcaneal 09/16/2011   Allergic rhinitis, seasonal 06/14/2011   S/P foot surgery, left 04/04/2011    Past Medical  History:  Diagnosis Date   Chronic foot pain     Past Surgical History:  Procedure Laterality Date   SUBTALAR DISTRACTION BONE BLOCK FUSION WITH ILIAC CREST BONE GRAFT Left     Social History   Tobacco Use   Smoking status: Former    Types: Cigarettes    Quit date: 08/30/2010    Years since quitting: 10.2   Smokeless tobacco: Never  Substance Use Topics   Drug use: No    Family History  Problem Relation Age of Onset   Hypertension Maternal Grandmother    Hypertension Maternal Grandfather    Anuerysm Father    Hypertension Father    Cancer Mother        Ovarian    No Known Allergies  Medication list has been reviewed and updated.  Current Outpatient Medications on File Prior to Visit  Medication Sig Dispense Refill   b complex vitamins capsule Take 1 capsule by mouth daily.     cetirizine (ZYRTEC) 10 MG tablet Take 1 tablet (10 mg total) by mouth daily. (Patient taking differently: Take 10 mg by mouth daily. Pt alternates between Claritin and Zyrtec every 6 months.) 90 tablet 1   Multiple Vitamin (MULTIVITAMIN) tablet Take 1 tablet by mouth daily.  oxyCODONE (OXY IR/ROXICODONE) 5 MG immediate release tablet Take 1 tablet (5 mg total) by mouth every 8 (eight) hours as needed. 30 tablet 0   Probiotic Product (PROBIOTIC ADVANCED PO) Take by mouth.     zinc gluconate 50 MG tablet Take 50 mg by mouth daily.     No current facility-administered medications on file prior to visit.    Review of Systems:  As per HPI- otherwise negative.   Physical Examination: Vitals:   12/11/20 0941  BP: 110/72  Pulse: 93  Resp: 18  Temp: 98.1 F (36.7 C)  SpO2: 97%   Vitals:   12/11/20 0941  Weight: 206 lb 6.4 oz (93.6 kg)  Height: 6' (1.829 m)   Body mass index is 27.99 kg/m. Ideal Body Weight: Weight in (lb) to have BMI = 25: 183.9  GEN: no acute distress.  Looks well, normal weight  HEENT: Atraumatic, Normocephalic.  Bilateral TM wnl, oropharynx normal.   PEERL,EOMI.   Ears and Nose: No external deformity. CV: RRR, No M/G/R. No JVD. No thrill. No extra heart sounds. PULM: CTA B, no wheezes, crackles, rhonchi. No retractions. No resp. distress. No accessory muscle use. ABD: S, NT, ND, +BS. No rebound. No HSM. EXTR: No c/c/e PSYCH: Normally interactive. Conversant.  Uses cane   Assessment and Plan: Physical exam  Screening for diabetes mellitus - Plan: Comprehensive metabolic panel, Hemoglobin A1c  Screening for hyperlipidemia - Plan: Lipid panel  Screening for deficiency anemia - Plan: CBC  Need for influenza vaccination - Plan: Flu Vaccine QUAD 6+ mos PF IM (Fluarix Quad PF)  Left foot pain  Coalition, talocalcaneal - Plan: Ambulatory referral to Sports Medicine, CANCELED: Ambulatory referral to Sports Medicine  Tinea versicolor - Plan: fluconazole (DIFLUCAN) 150 MG tablet  Blood typing encounter - Plan: ABO AND RH   Chronic pain syndrome - Plan: oxyCODONE (OXY IR/ROXICODONE) 5 MG immediate release tablet  Physical exam today - encouraged regular exercise and healthy diet  He wishes to know his blood type -he is aware that this test may not be covered by his insurance your blood counts are normal Flu shot given Will plan further follow- up pending labs. He uses diflucan for tinea versicolor on occasion    Signed Abbe Amsterdam, MD  Received labs as below, message to patient Results for orders placed or performed in visit on 12/11/20  CBC  Result Value Ref Range   WBC 4.1 4.0 - 10.5 K/uL   RBC 5.05 4.22 - 5.81 Mil/uL   Platelets 156.0 150.0 - 400.0 K/uL   Hemoglobin 15.0 13.0 - 17.0 g/dL   HCT 41.9 37.9 - 02.4 %   MCV 88.0 78.0 - 100.0 fl   MCHC 33.7 30.0 - 36.0 g/dL   RDW 09.7 35.3 - 29.9 %  Comprehensive metabolic panel  Result Value Ref Range   Sodium 140 135 - 145 mEq/L   Potassium 4.1 3.5 - 5.1 mEq/L   Chloride 100 96 - 112 mEq/L   CO2 32 19 - 32 mEq/L   Glucose, Bld 82 70 - 99 mg/dL   BUN 8 6 - 23 mg/dL    Creatinine, Ser 2.42 0.40 - 1.50 mg/dL   Total Bilirubin 1.3 (H) 0.2 - 1.2 mg/dL   Alkaline Phosphatase 62 39 - 117 U/L   AST 27 0 - 37 U/L   ALT 15 0 - 53 U/L   Total Protein 7.0 6.0 - 8.3 g/dL   Albumin 4.5 3.5 - 5.2 g/dL   GFR  113.12 >60.00 mL/min   Calcium 9.7 8.4 - 10.5 mg/dL  Hemoglobin N0N  Result Value Ref Range   Hgb A1c MFr Bld 5.4 4.6 - 6.5 %  Lipid panel  Result Value Ref Range   Cholesterol 219 (H) 0 - 200 mg/dL   Triglycerides 39.7 0.0 - 149.0 mg/dL   HDL 67.34 >19.37 mg/dL   VLDL 90.2 0.0 - 40.9 mg/dL   LDL Cholesterol 735 (H) 0 - 99 mg/dL   Total CHOL/HDL Ratio 3    NonHDL 148.48

## 2020-12-07 NOTE — Patient Instructions (Signed)
Good to see you again today- I will be in touch with your labs asap Try chiropractic for your back/ hip, but let me know if you would like to try physical therapy as well  Please see me in about 6 months for a recheck/ med check Flu shot given today

## 2020-12-11 ENCOUNTER — Ambulatory Visit (INDEPENDENT_AMBULATORY_CARE_PROVIDER_SITE_OTHER): Payer: Medicare HMO | Admitting: Family Medicine

## 2020-12-11 ENCOUNTER — Other Ambulatory Visit: Payer: Self-pay

## 2020-12-11 ENCOUNTER — Encounter: Payer: Self-pay | Admitting: Family Medicine

## 2020-12-11 VITALS — BP 110/72 | HR 93 | Temp 98.1°F | Resp 18 | Ht 72.0 in | Wt 206.4 lb

## 2020-12-11 DIAGNOSIS — B360 Pityriasis versicolor: Secondary | ICD-10-CM

## 2020-12-11 DIAGNOSIS — Z131 Encounter for screening for diabetes mellitus: Secondary | ICD-10-CM

## 2020-12-11 DIAGNOSIS — Z23 Encounter for immunization: Secondary | ICD-10-CM

## 2020-12-11 DIAGNOSIS — M79672 Pain in left foot: Secondary | ICD-10-CM | POA: Diagnosis not present

## 2020-12-11 DIAGNOSIS — Z0000 Encounter for general adult medical examination without abnormal findings: Secondary | ICD-10-CM | POA: Diagnosis not present

## 2020-12-11 DIAGNOSIS — Q6689 Other  specified congenital deformities of feet: Secondary | ICD-10-CM

## 2020-12-11 DIAGNOSIS — Z130 Encounter for screening for diseases of the blood and blood-forming organs and certain disorders involving the immune mechanism: Secondary | ICD-10-CM | POA: Diagnosis not present

## 2020-12-11 DIAGNOSIS — Z0183 Encounter for blood typing: Secondary | ICD-10-CM | POA: Diagnosis not present

## 2020-12-11 DIAGNOSIS — G894 Chronic pain syndrome: Secondary | ICD-10-CM

## 2020-12-11 DIAGNOSIS — Z13220 Encounter for screening for lipoid disorders: Secondary | ICD-10-CM

## 2020-12-11 LAB — COMPREHENSIVE METABOLIC PANEL
ALT: 15 U/L (ref 0–53)
AST: 27 U/L (ref 0–37)
Albumin: 4.5 g/dL (ref 3.5–5.2)
Alkaline Phosphatase: 62 U/L (ref 39–117)
BUN: 8 mg/dL (ref 6–23)
CO2: 32 mEq/L (ref 19–32)
Calcium: 9.7 mg/dL (ref 8.4–10.5)
Chloride: 100 mEq/L (ref 96–112)
Creatinine, Ser: 0.86 mg/dL (ref 0.40–1.50)
GFR: 113.12 mL/min (ref >60.00)
Glucose, Bld: 82 mg/dL (ref 70–99)
Potassium: 4.1 mEq/L (ref 3.5–5.1)
Sodium: 140 mEq/L (ref 135–145)
Total Bilirubin: 1.3 mg/dL — ABNORMAL HIGH (ref 0.2–1.2)
Total Protein: 7 g/dL (ref 6.0–8.3)

## 2020-12-11 LAB — LIPID PANEL
Cholesterol: 219 mg/dL — ABNORMAL HIGH (ref 0–200)
HDL: 70.6 mg/dL (ref >39.00)
LDL Cholesterol: 135 mg/dL — ABNORMAL HIGH (ref 0–99)
NonHDL: 148.48
Total CHOL/HDL Ratio: 3
Triglycerides: 69 mg/dL (ref 0.0–149.0)
VLDL: 13.8 mg/dL (ref 0.0–40.0)

## 2020-12-11 LAB — CBC
HCT: 44.5 % (ref 39.0–52.0)
Hemoglobin: 15 g/dL (ref 13.0–17.0)
MCHC: 33.7 g/dL (ref 30.0–36.0)
MCV: 88 fl (ref 78.0–100.0)
Platelets: 156 10*3/uL (ref 150.0–400.0)
RBC: 5.05 Mil/uL (ref 4.22–5.81)
RDW: 12.6 % (ref 11.5–15.5)
WBC: 4.1 10*3/uL (ref 4.0–10.5)

## 2020-12-11 LAB — HEMOGLOBIN A1C: Hgb A1c MFr Bld: 5.4 % (ref 4.6–6.5)

## 2020-12-11 MED ORDER — FLUCONAZOLE 150 MG PO TABS: ORAL_TABLET | ORAL | 0 refills | Status: DC

## 2020-12-11 MED ORDER — OXYCODONE HCL 5 MG PO TABS: 5.0000 mg | ORAL_TABLET | Freq: Three times a day (TID) | ORAL | 0 refills | Status: DC | PRN

## 2020-12-12 LAB — ABO AND RH

## 2020-12-21 ENCOUNTER — Encounter: Payer: Medicare HMO | Admitting: Family Medicine

## 2020-12-26 ENCOUNTER — Encounter: Payer: Medicare HMO | Admitting: Family Medicine

## 2020-12-26 NOTE — Progress Notes (Deleted)
  Douglas Nelson - 35 y.o. male MRN [id number]  Date of birth: [birth year]  SUBJECTIVE:  Including CC & ROS.  No chief complaint on file.   Douglas Nelson is a 35 y.o. male that is  ***.  ***   Review of Systems See HPI   HISTORY: Past Medical, Surgical, Social, and Family History Reviewed & Updated per EMR.   Pertinent Historical Findings include:  Past Medical History:  Diagnosis Date   Chronic foot pain     Past Surgical History:  Procedure Laterality Date   SUBTALAR DISTRACTION BONE BLOCK FUSION WITH ILIAC CREST BONE GRAFT Left     Family History  Problem Relation Age of Onset   Hypertension Maternal Grandmother    Hypertension Maternal Grandfather    Anuerysm Father    Hypertension Father    Cancer Mother        Ovarian    Social History   Socioeconomic History   Marital status: Married    Spouse name: Not on file   Number of children: Not on file   Years of education: Not on file   Highest education level: Not on file  Occupational History   Not on file  Tobacco Use   Smoking status: Former    Types: Cigarettes    Quit date: 08/30/2010    Years since quitting: 10.3   Smokeless tobacco: Never  Substance and Sexual Activity   Alcohol use: Not on file    Comment: occasionally   Drug use: No   Sexual activity: Not on file  Other Topics Concern   Not on file  Social History Narrative   Not on file   Social Determinants of Health   Financial Resource Strain: Not on file  Food Insecurity: Not on file  Transportation Needs: Not on file  Physical Activity: Not on file  Stress: Not on file  Social Connections: Not on file  Intimate Partner Violence: Not on file     PHYSICAL EXAM:  VS: There were no vitals taken for this visit. Physical Exam Gen: NAD, alert, cooperative with exam, well-appearing MSK:  ***      ASSESSMENT & PLAN:   No problem-specific Assessment & Plan notes found for this encounter.

## 2021-01-19 ENCOUNTER — Other Ambulatory Visit: Payer: Self-pay | Admitting: Family Medicine

## 2021-01-19 DIAGNOSIS — B360 Pityriasis versicolor: Secondary | ICD-10-CM

## 2021-01-19 DIAGNOSIS — G894 Chronic pain syndrome: Secondary | ICD-10-CM

## 2021-01-22 MED ORDER — FLUCONAZOLE 150 MG PO TABS: ORAL_TABLET | ORAL | 0 refills | Status: DC

## 2021-01-22 MED ORDER — OXYCODONE HCL 5 MG PO TABS: 5.0000 mg | ORAL_TABLET | Freq: Three times a day (TID) | ORAL | 0 refills | Status: DC | PRN

## 2021-01-23 ENCOUNTER — Other Ambulatory Visit: Payer: Self-pay | Admitting: Family Medicine

## 2021-01-23 DIAGNOSIS — G894 Chronic pain syndrome: Secondary | ICD-10-CM

## 2021-01-25 ENCOUNTER — Encounter: Payer: Self-pay | Admitting: Family Medicine

## 2021-01-25 MED ORDER — OXYCODONE HCL 5 MG PO TABS: 5.0000 mg | ORAL_TABLET | Freq: Three times a day (TID) | ORAL | 0 refills | Status: DC | PRN

## 2021-02-20 ENCOUNTER — Other Ambulatory Visit: Payer: Self-pay | Admitting: Family Medicine

## 2021-02-20 DIAGNOSIS — G894 Chronic pain syndrome: Secondary | ICD-10-CM

## 2021-02-20 MED ORDER — OXYCODONE HCL 5 MG PO TABS: 5.0000 mg | ORAL_TABLET | Freq: Three times a day (TID) | ORAL | 0 refills | Status: DC | PRN

## 2021-03-16 ENCOUNTER — Telehealth: Payer: Medicare HMO | Admitting: Emergency Medicine

## 2021-03-16 DIAGNOSIS — U071 COVID-19: Secondary | ICD-10-CM

## 2021-03-16 MED ORDER — NIRMATRELVIR/RITONAVIR (PAXLOVID)TABLET: 3.0000 | ORAL_TABLET | Freq: Two times a day (BID) | ORAL | 0 refills | Status: AC

## 2021-03-16 MED ORDER — BENZONATATE 100 MG PO CAPS: 100.0000 mg | ORAL_CAPSULE | Freq: Two times a day (BID) | ORAL | 0 refills | Status: DC | PRN

## 2021-03-16 NOTE — Progress Notes (Signed)
Virtual Visit Consent   Douglas Nelson, you are scheduled for a virtual visit with a [facility] provider today.     Just as with appointments in the office, your consent must be obtained to participate.  Your consent will be active for this visit and any virtual visit you may have with one of our providers in the next 365 days.     If you have a MyChart account, a copy of this consent can be sent to you electronically.  All virtual visits are billed to your insurance company just like a traditional visit in the office.    As this is a virtual visit, video technology does not allow for your provider to perform a traditional examination.  This may limit your provider's ability to fully assess your condition.  If your provider identifies any concerns that need to be evaluated in person or the need to arrange testing (such as labs, EKG, etc.), we will make arrangements to do so.     Although advances in technology are sophisticated, we cannot ensure that it will always work on either your end or our end.  If the connection with a video visit is poor, the visit may have to be switched to a telephone visit.  With either a video or telephone visit, we are not always able to ensure that we have a secure connection.     I need to obtain your verbal consent now.   Are you willing to proceed with your visit today?    Douglas Nelson has provided verbal consent on 03/16/2021 for a virtual visit video.   Montine Circle, PA-C   Date: 03/16/2021 10:25 AM   Virtual Visit via Video Note   I, Montine Circle, connected with  Douglas Nelson  ([id number], October 31, 1985) on 03/16/21 at 10:30 AM EST by a video-enabled telemedicine application and verified that I am speaking with the correct person using two identifiers.  Location: Patient: Virtual Visit Location Patient: Home Provider: Virtual Visit Location Provider: Home Office   I discussed the limitations of evaluation and management by telemedicine and the  availability of in person appointments. The patient expressed understanding and agreed to proceed.    History of Present Illness: Douglas Nelson is a 36 y.o. who identifies as a male who was assigned male at birth, and is being seen today for COVID 41. Onset of symptoms was yesterday.  Has associated cough, body aches, headache, loss of taste and/or smell.  Denies fever.  Denies any hx of asthma, COPD, DM.  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 (Patient taking differently: Take 10 mg by mouth daily. Pt alternates between Claritin and Zyrtec every 6 months.), Disp: 90 tablet, Rfl: 1   fluconazole (DIFLUCAN) 150 MG tablet, Take 2 tablets once, repeat in one week for tinea versicolor, Disp: 4 tablet, Rfl: 0   Multiple Vitamin (MULTIVITAMIN) tablet, Take 1 tablet by mouth daily.,  Disp: , Rfl:    oxyCODONE (OXY IR/ROXICODONE) 5 MG immediate release tablet, Take 1 tablet (5 mg total) by mouth every 8 (eight) hours as needed., Disp: 30 tablet, Rfl: 0   Probiotic Product (PROBIOTIC ADVANCED PO), Take by mouth., Disp: , Rfl:    zinc gluconate 50 MG tablet, Take 50 mg by mouth daily., Disp: , Rfl:   Observations/Objective: Patient is well-developed, well-nourished in no acute distress.  Resting comfortably at home.  Head is normocephalic, atraumatic.  No labored breathing.  Speech is clear and coherent with logical content.  Patient is alert and oriented at baseline.     Assessment and Plan: 1. COVID-19  -Paxlovid, last GFR is normal 12/2020.  Follow Up Instructions: I discussed the assessment and treatment plan with the patient. The patient was provided an opportunity to ask questions and all were answered. The patient agreed with the plan and demonstrated an understanding of the instructions.  A copy of instructions were sent to the patient via MyChart unless otherwise noted below.    The patient was advised to call back or seek an in-person evaluation if the symptoms worsen or if the condition fails to improve as anticipated.  Time:  I spent 24 minutes with the patient via telehealth technology discussing the above problems/concerns.    Montine Circle, PA-C

## 2021-03-16 NOTE — Progress Notes (Signed)
Based on the information that you have shared in the e-Visit Questionnaire, we recommend that you convert this visit to a video visit in order for the provider to better assess what is going on.  The provider will be able to give you a more accurate diagnosis and treatment plan if we can more freely discuss your symptoms and with the addition of a virtual examination.    We can't offer antivirals through an e-visit.  You'll need to schedule a video visit.  See questionnaire for reference below.  Question: I understand that FDA-approved TREATMENTS for COVID (pills and IV therapy can NOT be prescribed in this eVisit. (A Virtual Urgent Care Video Visit is recommended to discuss these options; available 8am to 8pm in MyChart Your Menu) Answer:   Yes  If you convert to a video visit, we will bill your insurance (similar to an office visit) and you will not be charged for this e-Visit. You will be able to stay at home and speak with the first available [facility] advanced practice provider. The link to do a video visit is in the drop down Menu tab of your Welcome screen in MyChart.   Approximately 5 minutes was used in reviewing the patient's chart, questionnaire, prescribing medications, and documentation.

## 2021-03-27 ENCOUNTER — Encounter: Payer: Self-pay | Admitting: Family Medicine

## 2021-03-27 DIAGNOSIS — G894 Chronic pain syndrome: Secondary | ICD-10-CM

## 2021-03-28 MED ORDER — OXYCODONE HCL 5 MG PO TABS: 5.0000 mg | ORAL_TABLET | Freq: Three times a day (TID) | ORAL | 0 refills | Status: DC | PRN

## 2021-04-04 ENCOUNTER — Encounter: Payer: Self-pay | Admitting: Family Medicine

## 2021-04-04 ENCOUNTER — Ambulatory Visit (INDEPENDENT_AMBULATORY_CARE_PROVIDER_SITE_OTHER): Payer: Medicare HMO | Admitting: Family Medicine

## 2021-04-04 VITALS — BP 121/68 | HR 61 | Temp 98.0°F | Ht 71.0 in | Wt 199.4 lb

## 2021-04-04 DIAGNOSIS — Z7184 Encounter for health counseling related to travel: Secondary | ICD-10-CM | POA: Diagnosis not present

## 2021-04-04 DIAGNOSIS — G8929 Other chronic pain: Secondary | ICD-10-CM

## 2021-04-04 DIAGNOSIS — M533 Sacrococcygeal disorders, not elsewhere classified: Secondary | ICD-10-CM | POA: Diagnosis not present

## 2021-04-04 DIAGNOSIS — L729 Follicular cyst of the skin and subcutaneous tissue, unspecified: Secondary | ICD-10-CM

## 2021-04-04 MED ORDER — ONDANSETRON 4 MG PO TBDP
4.0000 mg | ORAL_TABLET | Freq: Three times a day (TID) | ORAL | 0 refills | Status: DC | PRN
Start: 2021-04-04 — End: 2021-06-27

## 2021-04-04 MED ORDER — AZITHROMYCIN 500 MG PO TABS: ORAL_TABLET | ORAL | 0 refills | Status: DC

## 2021-04-04 MED ORDER — TYPHOID VACCINE PO CPDR: 1.0000 | DELAYED_RELEASE_CAPSULE | ORAL | 0 refills | Status: DC

## 2021-04-04 NOTE — Progress Notes (Signed)
Musculoskeletal Exam  Patient: Douglas Nelson DOB: 1985/07/12  DOS: 04/04/2021  SUBJECTIVE:  Chief Complaint:   Chief Complaint  Patient presents with   Back Pain    Tailbone pain Going out of the country in one week/preventative medication    Douglas M Hadsall is a 36 y.o.  male for evaluation and treatment of low back/tailbone pain.   Onset:  4 months ago. No inj or change in activity.  Location: tailbone Character:  aching  Severity: 5-6/10 Progression of issue:  has worsened Associated symptoms: felt a lump over R side Denies bruising, redness, swelling Treatment: to date has been rest and ice.   Neurovascular symptoms: no  Patient is going to the [place] next week for 3 weeks.  He is staying in an air B&B in the center [place] the country.  He is wondering if he needs any vaccinations for this.  Past Medical History:  Diagnosis Date   Chronic foot pain     Objective: VITAL SIGNS: BP 121/68    Pulse 61    Temp 98 F (36.7 C) (Oral)    Ht 5\' 11"  (1.803 m)    Wt 199 lb 6 oz (90.4 kg)    SpO2 98%    BMI 27.81 kg/m  Constitutional: Well formed, well developed. No acute distress. Thorax & Lungs: No accessory muscle use Musculoskeletal: Low back/tailbone region.   Tenderness to palpation: Mild tenderness over bilateral SI joints Deformity: no Ecchymosis: no Skin: There is a rubbery and circular lesion over the cephalad portion of the intergluteal cleft that is freely movable and nontender to palpation; there are no overlying skin hue changes Neurologic: Normal sensory function. No focal deficits noted.  Gait is normal. Psychiatric: Normal mood. Age appropriate judgment and insight. Alert & oriented x 3.    Assessment:  Cyst of skin - Plan: Ambulatory referral to General Surgery  Chronic SI joint pain - Plan: Ambulatory referral to Physical Therapy  Travel advice encounter - Plan: typhoid (VIVOTIF) DR capsule, azithromycin (ZITHROMAX) 500 MG tablet, ondansetron  (ZOFRAN-ODT) 4 MG disintegrating tablet  Plan: Refer to general surgery.  This is likely benign but given the location, I believe he would benefit from removal.   Refer to physical therapy.  Given stretches/exercises, heat, ice, Tylenol.  He will check the CDC website and let know if he needs malaria prophylaxis based off of the location he is going to.  I did send in the oral typhoid vaccine and a pocket prescription for azithromycin 500 mg daily for 3 days.  This would be an option if he has traveler's diarrhea or pneumonia while on the trip.  Zofran given for as needed use for nausea. F/u as needed or as originally scheduled with his regular PCP. The patient voiced understanding and agreement to the plan.  I spent 30 minutes with the patient discussing above plan, checking the CDC web site, and reviewing his chart on the same day of the visit.  [place], DO 04/04/21  3:19 PM

## 2021-04-04 NOTE — Patient Instructions (Addendum)
Heat (pad or rice pillow in microwave) over affected area, 10-15 minutes twice daily.   Ice/cold pack over area for 10-15 min twice daily.  If you do not hear anything about your referral in the next 1-2 weeks, call our office and ask for an update.  Try to avoid putting contact over skin.  Let us know if you need anything.  EXERCISES  RANGE OF MOTION (ROM) AND STRETCHING EXERCISES - Low Back Pain Most people with lower back pain will find that their symptoms get worse with excessive bending forward (flexion) or arching at the lower back (extension). The exercises that will help resolve your symptoms will focus on the opposite motion.  If you have pain, numbness or tingling which travels down into your buttocks, leg or foot, the goal of the therapy is for these symptoms to move closer to your back and eventually resolve. Sometimes, these leg symptoms will get better, but your lower back pain may worsen. This is often an indication of progress in your rehabilitation. Be very alert to any changes in your symptoms and the activities in which you participated in the 24 hours prior to the change. Sharing this information with your caregiver will allow him or her to most efficiently treat your condition. These exercises may help you when beginning to rehabilitate your injury. Your symptoms may resolve with or without further involvement from your physician, physical therapist or athletic trainer. While completing these exercises, remember:  Restoring tissue flexibility helps normal motion to return to the joints. This allows healthier, less painful movement and activity. An effective stretch should be held for at least 30 seconds. A stretch should never be painful. You should only feel a gentle lengthening or release in the stretched tissue. FLEXION RANGE OF MOTION AND STRETCHING EXERCISES:  STRETCH - Flexion, Single Knee to Chest  Lie on a firm bed or floor with both legs extended in front of  you. Keeping one leg in contact with the floor, bring your opposite knee to your chest. Hold your leg in place by either grabbing behind your thigh or at your knee. Pull until you feel a gentle stretch in your low back. Hold 30 seconds. Slowly release your grasp and repeat the exercise with the opposite side. Repeat 2 times. Complete this exercise 3 times per week.   STRETCH - Flexion, Double Knee to Chest Lie on a firm bed or floor with both legs extended in front of you. Keeping one leg in contact with the floor, bring your opposite knee to your chest. Tense your stomach muscles to support your back and then lift your other knee to your chest. Hold your legs in place by either grabbing behind your thighs or at your knees. Pull both knees toward your chest until you feel a gentle stretch in your low back. Hold 30 seconds. Tense your stomach muscles and slowly return one leg at a time to the floor. Repeat 2 times. Complete this exercise 3 times per week.   STRETCH - Low Trunk Rotation Lie on a firm bed or floor. Keeping your legs in front of you, bend your knees so they are both pointed toward the ceiling and your feet are flat on the floor. Extend your arms out to the side. This will stabilize your upper body by keeping your shoulders in contact with the floor. Gently and slowly drop both knees together to one side until you feel a gentle stretch in your low back. Hold for 30 seconds. Tense  your stomach muscles to support your lower back as you bring your knees back to the starting position. Repeat the exercise to the other side. Repeat 2 times. Complete this exercise at least 3 times per week.   EXTENSION RANGE OF MOTION AND FLEXIBILITY EXERCISES:  STRETCH - Extension, Prone on Elbows  Lie on your stomach on the floor, a bed will be too soft. Place your palms about shoulder width apart and at the height of your head. Place your elbows under your shoulders. If this is too painful, stack  pillows under your chest. Allow your body to relax so that your hips drop lower and make contact more completely with the floor. Hold this position for 30 seconds. Slowly return to lying flat on the floor. Repeat 2 times. Complete this exercise 3 times per week.   RANGE OF MOTION - Extension, Prone Press Ups Lie on your stomach on the floor, a bed will be too soft. Place your palms about shoulder width apart and at the height of your head. Keeping your back as relaxed as possible, slowly straighten your elbows while keeping your hips on the floor. You may adjust the placement of your hands to maximize your comfort. As you gain motion, your hands will come more underneath your shoulders. Hold this position 30 seconds. Slowly return to lying flat on the floor. Repeat 2 times. Complete this exercise 3 times per week.   RANGE OF MOTION- Quadruped, Neutral Spine  Assume a hands and knees position on a firm surface. Keep your hands under your shoulders and your knees under your hips. You may place padding under your knees for comfort. Drop your head and point your tailbone toward the ground below you. This will round out your lower back like an angry cat. Hold this position for 30 seconds. Slowly lift your head and release your tail bone so that your back sags into a large arch, like an old horse. Hold this position for 30 seconds. Repeat this until you feel limber in your low back. Now, find your "sweet spot." This will be the most comfortable position somewhere between the two previous positions. This is your neutral spine. Once you have found this position, tense your stomach muscles to support your low back. Hold this position for 30 seconds. Repeat 2 times. Complete this exercise 3 times per week.   STRENGTHENING EXERCISES - Low Back Sprain These exercises may help you when beginning to rehabilitate your injury. These exercises should be done near your "sweet spot." This is the neutral,  low-back arch, somewhere between fully rounded and fully arched, that is your least painful position. When performed in this safe range of motion, these exercises can be used for people who have either a flexion or extension based injury. These exercises may resolve your symptoms with or without further involvement from your physician, physical therapist or athletic trainer. While completing these exercises, remember:  Muscles can gain both the endurance and the strength needed for everyday activities through controlled exercises. Complete these exercises as instructed by your physician, physical therapist or athletic trainer. Increase the resistance and repetitions only as guided. You may experience muscle soreness or fatigue, but the pain or discomfort you are trying to eliminate should never worsen during these exercises. If this pain does worsen, stop and make certain you are following the directions exactly. If the pain is still present after adjustments, discontinue the exercise until you can discuss the trouble with your caregiver.  STRENGTHENING - Deep  Abdominals, Pelvic Tilt  Lie on a firm bed or floor. Keeping your legs in front of you, bend your knees so they are both pointed toward the ceiling and your feet are flat on the floor. Tense your lower abdominal muscles to press your low back into the floor. This motion will rotate your pelvis so that your tail bone is scooping upwards rather than pointing at your feet or into the floor. With a gentle tension and even breathing, hold this position for 3 seconds. Repeat 2 times. Complete this exercise 3 times per week.   STRENGTHENING - Abdominals, Crunches  Lie on a firm bed or floor. Keeping your legs in front of you, bend your knees so they are both pointed toward the ceiling and your feet are flat on the floor. Cross your arms over your chest. Slightly tip your chin down without bending your neck. Tense your abdominals and slowly lift your  trunk high enough to just clear your shoulder blades. Lifting higher can put excessive stress on the lower back and does not further strengthen your abdominal muscles. Control your return to the starting position. Repeat 2 times. Complete this exercise 3 times per week.   STRENGTHENING - Quadruped, Opposite UE/LE Lift  Assume a hands and knees position on a firm surface. Keep your hands under your shoulders and your knees under your hips. You may place padding under your knees for comfort. Find your neutral spine and gently tense your abdominal muscles so that you can maintain this position. Your shoulders and hips should form a rectangle that is parallel with the floor and is not twisted. Keeping your trunk steady, lift your right hand no higher than your shoulder and then your left leg no higher than your hip. Make sure you are not holding your breath. Hold this position for 30 seconds. Continuing to keep your abdominal muscles tense and your back steady, slowly return to your starting position. Repeat with the opposite arm and leg. Repeat 2 times. Complete this exercise 3 times per week.   STRENGTHENING - Abdominals and Quadriceps, Straight Leg Raise  Lie on a firm bed or floor with both legs extended in front of you. Keeping one leg in contact with the floor, bend the other knee so that your foot can rest flat on the floor. Find your neutral spine, and tense your abdominal muscles to maintain your spinal position throughout the exercise. Slowly lift your straight leg off the floor about 6 inches for a count of 3, making sure to not hold your breath. Still keeping your neutral spine, slowly lower your leg all the way to the floor. Repeat this exercise with each leg 2 times. Complete this exercise 3 times per week.  POSTURE AND BODY MECHANICS CONSIDERATIONS - Low Back Sprain Keeping correct posture when sitting, standing or completing your activities will reduce the stress put on different body  tissues, allowing injured tissues a chance to heal and limiting painful experiences. The following are general guidelines for improved posture.  While reading these guidelines, remember: The exercises prescribed by your provider will help you have the flexibility and strength to maintain correct postures. The correct posture provides the best environment for your joints to work. All of your joints have less wear and tear when properly supported by a spine with good posture. This means you will experience a healthier, less painful body. Correct posture must be practiced with all of your activities, especially prolonged sitting and standing. Correct posture is as  important when doing repetitive low-stress activities (typing) as it is when doing a single heavy-load activity (lifting).  RESTING POSITIONS Consider which positions are most painful for you when choosing a resting position. If you have pain with flexion-based activities (sitting, bending, stooping, squatting), choose a position that allows you to rest in a less flexed posture. You would want to avoid curling into a fetal position on your side. If your pain worsens with extension-based activities (prolonged standing, working overhead), avoid resting in an extended position such as sleeping on your stomach. Most people will find more comfort when they rest with their spine in a more neutral position, neither too rounded nor too arched. Lying on a non-sagging bed on your side with a pillow between your knees, or on your back with a pillow under your knees will often provide some relief. Keep in mind, being in any one position for a prolonged period of time, no matter how correct your posture, can still lead to stiffness.  PROPER SITTING POSTURE In order to minimize stress and discomfort on your spine, you must sit with correct posture. Sitting with good posture should be effortless for a healthy body. Returning to good posture is a gradual process.  Many people can work toward this most comfortably by using various supports until they have the flexibility and strength to maintain this posture on their own. When sitting with proper posture, your ears will fall over your shoulders and your shoulders will fall over your hips. You should use the back of the chair to support your upper back. Your lower back will be in a neutral position, just slightly arched. You may place a small pillow or folded towel at the base of your lower back for  support.  When working at a desk, create an environment that supports good, upright posture. Without extra support, muscles tire, which leads to excessive strain on joints and other tissues. Keep these recommendations in mind:  CHAIR: A chair should be able to slide under your desk when your back makes contact with the back of the chair. This allows you to work closely. The chair's height should allow your eyes to be level with the upper part of your monitor and your hands to be slightly lower than your elbows.  BODY POSITION Your feet should make contact with the floor. If this is not possible, use a foot rest. Keep your ears over your shoulders. This will reduce stress on your neck and low back.  INCORRECT SITTING POSTURES  If you are feeling tired and unable to assume a healthy sitting posture, do not slouch or slump. This puts excessive strain on your back tissues, causing more damage and pain. Healthier options include: Using more support, like a lumbar pillow. Switching tasks to something that requires you to be upright or walking. Talking a brief walk. Lying down to rest in a neutral-spine position.  PROLONGED STANDING WHILE SLIGHTLY LEANING FORWARD  When completing a task that requires you to lean forward while standing in one place for a long time, place either foot up on a stationary 2-4 inch high object to help maintain the best posture. When both feet are on the ground, the lower back tends to lose  its slight inward curve. If this curve flattens (or becomes too large), then the back and your other joints will experience too much stress, tire more quickly, and can cause pain.  CORRECT STANDING POSTURES Proper standing posture should be assumed with all daily activities, even  if they only take a few moments, like when brushing your teeth. As in sitting, your ears should fall over your shoulders and your shoulders should fall over your hips. You should keep a slight tension in your abdominal muscles to brace your spine. Your tailbone should point down to the ground, not behind your body, resulting in an over-extended swayback posture.   INCORRECT STANDING POSTURES  Common incorrect standing postures include a forward head, locked knees and/or an excessive swayback. WALKING Walk with an upright posture. Your ears, shoulders and hips should all line-up.  PROLONGED ACTIVITY IN A FLEXED POSITION When completing a task that requires you to bend forward at your waist or lean over a low surface, try to find a way to stabilize 3 out of 4 of your limbs. You can place a hand or elbow on your thigh or rest a knee on the surface you are reaching across. This will provide you more stability, so that your muscles do not tire as quickly. By keeping your knees relaxed, or slightly bent, you will also reduce stress across your lower back. CORRECT LIFTING TECHNIQUES  DO : Assume a wide stance. This will provide you more stability and the opportunity to get as close as possible to the object which you are lifting. Tense your abdominals to brace your spine. Bend at the knees and hips. Keeping your back locked in a neutral-spine position, lift using your leg muscles. Lift with your legs, keeping your back straight. Test the weight of unknown objects before attempting to lift them. Try to keep your elbows locked down at your sides in order get the best strength from your shoulders when carrying an object.   Always  ask for help when lifting heavy or awkward objects. INCORRECT LIFTING TECHNIQUES DO NOT:  Lock your knees when lifting, even if it is a small object. Bend and twist. Pivot at your feet or move your feet when needing to change directions. Assume that you can safely pick up even a paperclip without proper posture.

## 2021-04-09 ENCOUNTER — Other Ambulatory Visit: Payer: Self-pay | Admitting: Family Medicine

## 2021-04-09 DIAGNOSIS — G894 Chronic pain syndrome: Secondary | ICD-10-CM

## 2021-04-10 MED ORDER — OXYCODONE HCL 5 MG PO TABS: 5.0000 mg | ORAL_TABLET | Freq: Three times a day (TID) | ORAL | 0 refills | Status: DC | PRN

## 2021-05-07 ENCOUNTER — Ambulatory Visit: Payer: Medicare HMO | Attending: Family Medicine | Admitting: Physical Therapy

## 2021-05-07 ENCOUNTER — Encounter: Payer: Self-pay | Admitting: Physical Therapy

## 2021-05-07 ENCOUNTER — Other Ambulatory Visit: Payer: Self-pay

## 2021-05-07 DIAGNOSIS — G8929 Other chronic pain: Secondary | ICD-10-CM | POA: Diagnosis not present

## 2021-05-07 DIAGNOSIS — M533 Sacrococcygeal disorders, not elsewhere classified: Secondary | ICD-10-CM | POA: Insufficient documentation

## 2021-05-07 DIAGNOSIS — M6281 Muscle weakness (generalized): Secondary | ICD-10-CM | POA: Diagnosis not present

## 2021-05-07 NOTE — Therapy (Signed)
[facility] [address]  [place], [place], [address] Phone: [phone]   Fax:  [phone]  Physical Therapy Evaluation  Patient Details  Name: Douglas Nelson MRN: [id number] Date of Birth: 03-06-1986 Referring Provider (PT): Audelia Acton, DO   Encounter Date: 05/07/2021   PT End of Session - 05/07/21 1324     Visit Number 1    Number of Visits 12    Date for PT Re-Evaluation 06/18/21    Authorization Type [employer] Medicare    PT Start Time 1324    PT Stop Time 1402    PT Time Calculation (min) 38 min    Activity Tolerance Patient tolerated treatment well    Behavior During Therapy [facility] for tasks assessed/performed             Past Medical History:  Diagnosis Date   Chronic foot pain     Past Surgical History:  Procedure Laterality Date   SUBTALAR DISTRACTION BONE BLOCK FUSION WITH ILIAC CREST BONE GRAFT Left     There were no vitals filed for this visit.    Subjective Assessment - 05/07/21 1327     Subjective Pt reports he has had a couple reconstructive surgeries on his L foot and ankle due to hyperpronation. The 1st surgery did not go well due to a natural coalition. He ended up in a walking boot for 7 months w/o PT, then went [facility] where he had PT then another ankle surgery. He continues to use a cane for walking due to his ankle. He reports long term h/o pain in his low back and tailbone with what appeared to be a cyst identified recently by MD. Tailbone pain has worsened over the past few months.    Pertinent History Chronic L foot and ankle pain; L talocalcaneal coalition; L subtalar distraction bone block fusion with iliac crest bone graft    Limitations Sitting;Standing;Walking    How long can you sit comfortably? 10-15 minutes - due to tailbone    How long can you stand comfortably? 30 minutes - due to L foot and ankle    How long can you walk comfortably? 30 minutes - due to L foot and  ankle    Patient Stated Goals "To be able to sit and lay with less discomfort"    Currently in Pain? Yes    Pain Score 4     Pain Location Coccyx    Pain Orientation Lower    Pain Descriptors / Indicators Dull   "like it is bruised"   Pain Type Acute pain;Chronic pain    Pain Radiating Towards n/a    Pain Onset More than a month ago   chronic with exacerbation over past 2 months   Pain Frequency Constant   70% of the time if sitting or laying down   Aggravating Factors  prolonged sitting, laying down    Pain Relieving Factors change positions, heating blanket, ice    Effect of Pain on Daily Activities just uncomfortable all the time but does not necessarily inhibit him                [facility] PT Assessment - 05/07/21 1324       Assessment   Medical Diagnosis Chronic SI joint pain    Referring Provider (PT) Audelia Acton, DO    Onset Date/Surgical Date --   chronic for a couple years with exacerbation over the past 2 months   Hand  Dominance Right    Next MD Visit none    Prior Therapy PT for L foot/ankle between 2 surgeries      Precautions   Precautions None      Restrictions   Weight Bearing Restrictions No      Balance Screen   Has the patient fallen in the past 6 months No    Has the patient had a decrease in activity level because of a fear of falling?  No    Is the patient reluctant to leave their home because of a fear of falling?  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    Leisure reading, chess, exercise a few times per week - recumbent bike, UB weights/bands,  pull-up bar (limited lower body due to L foot/ankle)      Cognition   Overall Cognitive Status Within Functional Limits for tasks assessed      ROM / Strength   AROM / PROM / Strength AROM;Strength      AROM   Overall AROM  Within functional limits for tasks performed   except as below   AROM Assessment Site Lumbar    Lumbar - Right Side Bend limited to hand to lateral knee    Lumbar - Left Side Bend limited to hand to lateral knee      Strength   Overall Strength Within functional limits for tasks performed    Strength Assessment Site Hip;Knee    Right/Left Hip Right;Left    Right Hip Flexion 4/5    Right Hip Extension 4-/5    Right Hip External Rotation  4/5    Right Hip Internal Rotation 4+/5    Right Hip ABduction 4-/5    Right Hip ADduction 4-/5    Left Hip Flexion 4/5    Left Hip Extension 4-/5    Left Hip External Rotation 4/5    Left Hip Internal Rotation 4+/5    Left Hip ABduction 4-/5    Left Hip ADduction 4-/5    Right/Left Knee Right;Left    Right Knee Flexion 4+/5    Right Knee Extension 4+/5    Left Knee Flexion 4+/5    Left Knee Extension 4+/5      Flexibility   Soft Tissue Assessment /Muscle Length yes    Hamstrings mild/mod tight L>R    Quadriceps mild tight B    ITB WFL    Piriformis mild tight B      Palpation   SI assessment  hypomobile    Palpation comment mild increased muscle tension in lumbar paraspinals, mild muscle atrophy in glute but denies TTP                        Objective measurements completed on examination: See above findings.                PT Education - 05/07/21 1400     Education Details PT eval findings and anticipated POC    Person(s) Educated  Patient    Methods Explanation    Comprehension Verbalized understanding              PT Short Term Goals - 05/07/21 1402       PT SHORT TERM GOAL #1   Title Patient will be independent with initial HEP    Status New    Target Date 05/28/21                PT Long Term Goals - 05/07/21 1402       PT LONG TERM GOAL #1   Title Patient will demonstrate independent use of ongoing/advanced HEP to facilitate ability to maintain/progress functional gains from skilled physical therapy services    Status New    Target Date 06/18/21      PT LONG TERM GOAL #2   Title Patient to report reduction in frequency and intensity of coccygeal pain by >/= 50% to allow for improved positional and activity tolerance    Status New    Target Date 06/18/21      PT LONG TERM GOAL #3   Title Patient will demonstrate improved B proximal LE strength to >/= 4+/5 for improved stability and ease of mobility    Status New    Target Date 06/18/21      PT LONG TERM GOAL #4   Title Patient will be able to sit for at least 30 minutes for meals or riding in the car with pain level 3/10 or less    Status New    Target Date 06/18/21      PT LONG TERM GOAL #5   Title Patient will report no sleep disturbance due to coccygeal pain when laying in bed    Status New    Target Date 06/18/21                    Plan - 05/07/21 1402     Clinical Impression Statement Douglas is a 36 y/o male who presents to OP PT for chronic coccygeal pain for the past few years with acute exacerbation over the past 2 months. He attributes some of the pain to altered mobility and limited ability to exercise lower body due to chronic L foot and ankle pain s/p surgery x 2 but is unsure what has triggered the more recent exacerbation. Pain limits sitting tolerance as well as tolerance for laying down, but feels standing and walking more limited due to L foot and ankle. Deficits include hypomobility of SI joint, limited proximal LE flexibility and proximal LE weakness. Douglas will benefit from skilled PT to address above deficits and improve flexibility along with core/proximal LE strength to improve positional and activity tolerance with decreased coccygeal pain interference.     Personal Factors and Comorbidities Time since onset of injury/illness/exacerbation;Past/Current Experience;Fitness;Comorbidity 1    Comorbidities Chronic L foot and ankle pain; L talocalcaneal coalition; L subtalar distraction bone block fusion with iliac crest bone graft    Examination-Activity Limitations Bed Mobility;Sit;Sleep    Examination-Participation Restrictions Driving    Stability/Clinical Decision Making Stable/Uncomplicated    Clinical Decision Making Low    Rehab Potential Good    PT Frequency 2x / week    PT Duration 4 weeks    PT Treatment/Interventions ADLs/Self Care Home Management;Cryotherapy;Electrical Stimulation;Iontophoresis 4mg /ml Dexamethasone;Moist Heat;Functional mobility training;Therapeutic activities;Therapeutic exercise;Neuromuscular re-education;Patient/family education;Manual techniques;Passive range of motion;Dry needling;Taping;Spinal Manipulations    PT Next Visit Plan Create initial HEP for proximal LE flexibility and strengthening    Consulted  and Agree with Plan of Care Patient             Patient will benefit from skilled therapeutic intervention in order to improve the following deficits and impairments:  Abnormal gait, Decreased activity tolerance, Decreased mobility, Decreased strength, Difficulty walking, Increased fascial restricitons, Increased muscle spasms, Impaired flexibility, Improper body mechanics, Postural dysfunction, Pain  Visit Diagnosis: Sacrococcygeal disorders, not elsewhere classified  Muscle weakness (generalized)     Problem List Patient Active Problem List   Diagnosis Date Noted   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  [place], [place], [address] Phone: [phone]   Fax:  [phone]  Name: Douglas M Sinnett MRN: [id number] Date of Birth: Aug 25, 1985

## 2021-05-07 NOTE — Progress Notes (Unsigned)
Subjective:   Douglas Nelson is a 36 y.o. male who presents for Medicare Annual/Subsequent preventive examination.  I connected with Douglas today by a video enabled telemedicine application and verified that I am speaking with the correct person using two identifiers.  Location of patient:*** Location of provider:Work  Persons participating in the virtual visit: patient, nurse.   I discussed the limitations, risk, security and privacy concerns of evaluation and management by telemedicine. The patient expressed understanding and agreed to proceed.  Some vital signs may be absent or patient reported.   Review of Systems    ***       Objective:    There were no vitals filed for this visit. There is no height or weight on file to calculate BMI.  Advanced Directives 05/07/2021  Does Patient Have a Medical Advance Directive? Yes  Type of Estate agent of [place];Living will  Does patient want to make changes to medical advance directive? No - Patient declined  Copy of Healthcare Power of Attorney in Chart? Yes - validated most recent copy scanned in chart (See row information)    Current Medications (verified) Outpatient Encounter Medications as of 05/08/2021  Medication Sig   azithromycin (ZITHROMAX) 500 MG tablet Take 1 tab daily for 3 days if you have traveller's diarrhea or pneumonia. (Patient not taking: Reported on 05/07/2021)   b complex vitamins capsule Take 1 capsule by mouth daily.   benzonatate (TESSALON) 100 MG capsule Take 1 capsule (100 mg total) by mouth 2 (two) times daily as needed for cough. (Patient not taking: Reported on 05/07/2021)   cetirizine (ZYRTEC) 10 MG tablet Take 1 tablet (10 mg total) by mouth daily. (Patient taking differently: Take 10 mg by mouth daily. Pt alternates between Claritin and Zyrtec every 6 months.)   Multiple Vitamin (MULTIVITAMIN) tablet Take 1 tablet by mouth daily.   ondansetron (ZOFRAN-ODT) 4 MG disintegrating  tablet Take 1 tablet (4 mg total) by mouth every 8 (eight) hours as needed for nausea or vomiting. (Patient not taking: Reported on 05/07/2021)   oxyCODONE (OXY IR/ROXICODONE) 5 MG immediate release tablet Take 1 tablet (5 mg total) by mouth every 8 (eight) hours as needed.   Probiotic Product (PROBIOTIC ADVANCED PO) Take by mouth.   typhoid (VIVOTIF) DR capsule Take 1 capsule by mouth every other day. (Patient not taking: Reported on 05/07/2021)   zinc gluconate 50 MG tablet Take 50 mg by mouth daily.   No facility-administered encounter medications on file as of 05/08/2021.    Allergies (verified) Patient has no known allergies.   History: Past Medical History:  Diagnosis Date   Chronic foot pain    Past Surgical History:  Procedure Laterality Date   SUBTALAR DISTRACTION BONE BLOCK FUSION WITH ILIAC CREST BONE GRAFT Left    Family History  Problem Relation Age of Onset   Hypertension Maternal Grandmother    Hypertension Maternal Grandfather    Anuerysm Father    Hypertension Father    Cancer Mother        Ovarian   Social History   Socioeconomic History   Marital status: Married    Spouse name: Not on file   Number of children: Not on file   Years of education: Not on file   Highest education level: Not on file  Occupational History   Not on file  Tobacco Use   Smoking status: Former    Types: Cigarettes    Quit date: 08/30/2010    Years  since quitting: 10.6   Smokeless tobacco: Never  Substance and Sexual Activity   Alcohol use: Not on file    Comment: occasionally   Drug use: No   Sexual activity: Not on file  Other Topics Concern   Not on file  Social History Narrative   Not on file   Social Determinants of Health   Financial Resource Strain: Not on file  Food Insecurity: Not on file  Transportation Needs: Not on file  Physical Activity: Not on file  Stress: Not on file  Social Connections: Not on file    Tobacco Counseling Counseling given: Not  Answered   Clinical Intake:                 Diabetic?No         Activities of Daily Living No flowsheet data found.  Patient Care Team: Copland, Gwenlyn Found, MD as PCP - General (Family Medicine)  Indicate any recent Medical Services you may have received from other than [facility] providers in the past year (date may be approximate).     Assessment:   This is a routine wellness examination for Douglas.  Hearing/Vision screen No results found.  Dietary issues and exercise activities discussed:     Goals Addressed   None    Depression Screen PHQ 2/9 Scores 12/11/2020 02/04/2018 11/25/2016 11/10/2014 11/08/2013  PHQ - 2 Score 0 0 0 0 0  Exception Documentation - - Patient refusal - -    Fall Risk Fall Risk  04/04/2021 11/25/2016 11/10/2014 11/08/2013  Falls in the past year? 0 Yes No No  Number falls in past yr: 0 2 or more - -  Injury with Fall? 0 No - -  Follow up - Falls prevention discussed - -    FALL RISK PREVENTION PERTAINING TO THE HOME:  Any stairs in or around the home? {YES/NO:21197} If so, are there any without handrails? {YES/NO:21197} Home free of loose throw rugs in walkways, pet beds, electrical cords, etc? {YES/NO:21197} Adequate lighting in your home to reduce risk of falls? {YES/NO:21197}  ASSISTIVE DEVICES UTILIZED TO PREVENT FALLS:  Life alert? {YES/NO:21197} Use of a cane, walker or w/c? {YES/NO:21197} Grab bars in the bathroom? {YES/NO:21197} Shower chair or bench in shower? {YES/NO:21197} Elevated toilet seat or a handicapped toilet? {YES/NO:21197}  TIMED UP AND GO:  Was the test performed? {YES/NO:21197}.  Length of time to ambulate 10 feet: *** sec.   {Appearance of HGDJ:2426834}  Cognitive Function:        Immunizations Immunization History  Administered Date(s) Administered   Influenza,inj,Quad PF,6+ Mos 11/25/2016, 12/01/2017, 12/07/2018, 12/08/2019, 12/11/2020   Moderna Covid-19 Vaccine Bivalent Booster 44yrs & up  11/28/2020   Moderna Sars-Covid-2 Vaccination 05/31/2019, 06/28/2019, 01/05/2020   Tdap 11/22/2015    TDAP status: Up to date  Flu Vaccine status: Up to date  Pneumococcal vaccine status: Due at age 69  Covid-19 vaccine status: Completed vaccines  Qualifies for Shingles Vaccine? No     Screening Tests Health Maintenance  Topic Date Due   TETANUS/TDAP  11/21/2025   INFLUENZA VACCINE  Completed   COVID-19 Vaccine  Completed   Hepatitis C Screening  Completed   HIV Screening  Completed   HPV VACCINES  Aged Out    Health Maintenance  There are no preventive care reminders to display for this patient.  Colorectal cancer screening: Due at age 12  Lung Cancer Screening: (Low Dose CT Chest recommended if Age 76-80 years, 30 pack-year currently smoking OR have quit  w/in 15years.) does not qualify.     Additional Screening:  Hepatitis C Screening: Completed 12/08/2019  Vision Screening: Recommended annual ophthalmology exams for early detection of glaucoma and other disorders of the eye. Is the patient up to date with their annual eye exam?  {YES/NO:21197} Who is the provider or what is the name of the office in which the patient attends annual eye exams? *** If pt is not established with a provider, would they like to be referred to a provider to establish care? {YES/NO:21197}.   Dental Screening: Recommended annual dental exams for proper oral hygiene  Community Resource Referral / Chronic Care Management: CRR required this visit?  {YES/NO:21197}  CCM required this visit?  {YES/NO:21197}     Plan:     I have personally reviewed and noted the following in the patients chart:   Medical and social history Use of alcohol, tobacco or illicit drugs  Current medications and supplements including opioid prescriptions. {Opioid Prescriptions:[phone]} Functional ability and status Nutritional status Physical activity Advanced directives List of other  physicians Hospitalizations, surgeries, and ER visits in previous 12 months Vitals Screenings to include cognitive, depression, and falls Referrals and appointments  In addition, I have reviewed and discussed with patient certain preventive protocols, quality metrics, and best practice recommendations. A written personalized care plan for preventive services as well as general preventive health recommendations were provided to patient.   Due to this being a *** visit, the after visit summary with patients personalized plan was offered to patient via mail or my-chart. ***Patient declined at this time./ Patient would like to access on my-chart/ per request, patient was mailed a copy of AVS./ Patient preferred to pick up at office at next visit.   Roanna Raider, LPN   05/04/8248  Nurse Health Advisor  Nurse Notes: ***

## 2021-05-10 ENCOUNTER — Ambulatory Visit: Payer: Medicare HMO | Attending: Family Medicine

## 2021-05-10 ENCOUNTER — Other Ambulatory Visit: Payer: Self-pay

## 2021-05-10 DIAGNOSIS — M6281 Muscle weakness (generalized): Secondary | ICD-10-CM | POA: Insufficient documentation

## 2021-05-10 DIAGNOSIS — M533 Sacrococcygeal disorders, not elsewhere classified: Secondary | ICD-10-CM | POA: Insufficient documentation

## 2021-05-10 NOTE — Patient Instructions (Signed)
Access Code: [email or web address] ?URL: [email or web address] ?Date: 05/10/2021 ?Prepared by: Verta Ellen ? ?Exercises ?Seated Table Hamstring Stretch - 2 x daily - 7 x weekly - 2 sets - 2 reps - 30 second hold ?Seated Table Piriformis Stretch - 2 x daily - 7 x weekly - 2 sets - 2 reps - 30 sec hold ?Supine Bridge - 1 x daily - 7 x weekly - 2 sets - 10 reps ?Active Straight Leg Raise with Quad Set - 1 x daily - 7 x weekly - 2 sets - 10 reps ?Clamshell - 1 x daily - 7 x weekly - 2 sets - 10 reps ? ?

## 2021-05-10 NOTE — Therapy (Signed)
[place] [facility] ?[address]  [address] ?[place], [place], [address] ?Phone: [phone]   Fax:  [phone] ? ?Physical Therapy Treatment ? ?Patient Details  ?Name: Douglas Nelson ?MRN: [id number] ?Date of Birth: 24-Oct-1985 ?Referring Provider (PT): Marcheta Grammes, DO ? ? ?Encounter Date: 05/10/2021 ? ? PT End of Session - 05/10/21 1148   ? ? Visit Number 2   ? Number of Visits 12   ? Date for PT Re-Evaluation 06/18/21   ? Authorization Type [employer]na Medicare   ? PT Start Time 1108   pt late  ? PT Stop Time 1145   ? PT Time Calculation (min) 37 min   ? Activity Tolerance Patient tolerated treatment well   ? Behavior During Therapy [facility] for tasks assessed/performed   ? ?  ?  ? ?  ? ? ?Past Medical History:  ?Diagnosis Date  ? Chronic foot pain   ? ? ?Past Surgical History:  ?Procedure Laterality Date  ? SUBTALAR DISTRACTION BONE BLOCK FUSION WITH ILIAC CREST BONE GRAFT Left   ? ? ?There were no vitals filed for this visit. ? ? Subjective Assessment - 05/10/21 1113   ? ? Subjective Tailbone pain is consistent, ice/heat help with pain. Feels off balance so he uses a cane to walk with.   ? Pertinent History Chronic L foot and ankle pain; L talocalcaneal coalition; L subtalar distraction bone block fusion with iliac crest bone graft   ? Patient Stated Goals "To be able to sit and lay with less discomfort"   ? Currently in Pain? Yes   ? Pain Score 4    ? Pain Location Coccyx   ? Pain Orientation Lower   ? Pain Descriptors / Indicators Discomfort   ? ?  ?  ? ?  ? ? ? ? ? ? ? ? ? ? ? ? ? ? ? ? ? ? ? ? [facility] Adult PT Treatment/Exercise - 05/10/21 0001   ? ?  ? Exercises  ? Exercises Knee/Hip   ?  ? Knee/Hip Exercises: Stretches  ? Passive Hamstring Stretch Right;Left;1 rep;30 seconds   ? Passive Hamstring Stretch Limitations seated swith leg propped on mat + supine with strap   ? Piriformis Stretch Right;Left;1 rep;30 seconds   ? Piriformis Stretch Limitations seated pigeon pose  position   R side tighter than L  ?  ? Knee/Hip Exercises: Aerobic  ? Recumbent Bike L2x20min   ?  ? Knee/Hip Exercises: Supine  ? Bridges Strengthening;Both;10 reps   ? Bridges Limitations 3 sec hold, abd brace, arms crossed   ? Straight Leg Raises Strengthening;Both;10 reps   ? Straight Leg Raises Limitations cues for QS prior to lift   ? Other Supine Knee/Hip Exercises LTR 5x5" R/L   ?  ? Knee/Hip Exercises: Sidelying  ? Clams R/L 10x3" no resistance   ? ?  ?  ? ?  ? ? ? ? ? ? ? ? ? ? PT Education - 05/10/21 1148   ? ? Education Details HEP given and reviewed exercises   ? Person(s) Educated Patient   ? Methods Explanation;Demonstration;Tactile cues;Handout   ? Comprehension Verbalized understanding;Returned demonstration;Need further instruction   ? ?  ?  ? ?  ? ? ? PT Short Term Goals - 05/10/21 1157   ? ?  ? PT SHORT TERM GOAL #1  ? Title Patient will be independent with initial HEP   ? Status On-going   ? Target Date  05/28/21   ? ?  ?  ? ?  ? ? ? ? PT Long Term Goals - 05/10/21 1157   ? ?  ? PT LONG TERM GOAL #1  ? Title Patient will demonstrate independent use of ongoing/advanced HEP to facilitate ability to maintain/progress functional gains from skilled physical therapy services   ? Status On-going   ? Target Date 06/18/21   ?  ? PT LONG TERM GOAL #2  ? Title Patient to report reduction in frequency and intensity of coccygeal pain by >/= 50% to allow for improved positional and activity tolerance   ? Status On-going   ? Target Date 06/18/21   ?  ? PT LONG TERM GOAL #3  ? Title Patient will demonstrate improved B proximal LE strength to >/= 4+/5 for improved stability and ease of mobility   ? Status On-going   ? Target Date 06/18/21   ?  ? PT LONG TERM GOAL #4  ? Title Patient will be able to sit for at least 30 minutes for meals or riding in the car with pain level 3/10 or less   ? Status On-going   ? Target Date 06/18/21   ?  ? PT LONG TERM GOAL #5  ? Title Patient will report no sleep disturbance due to  coccygeal pain when laying in bed   ? Status On-going   ? Target Date 06/18/21   ? ?  ?  ? ?  ? ? ? ? ? ? ? ? Plan - 05/10/21 1149   ? ? Clinical Impression Statement Pt arrived late to session today. Started HEP with stretches and strengthening exercises for the hip and lumbopelvic region of the body. His R piriformis was more tight in figure 4 position than the L. With cues given patient showed a good demonstration of the exercises with no increased pain. I gave him some options of NWB leg exercises to start strengthening his hips at the gym and told him we could review these machines if needed. Pt would benefit from more strengthening for the lumbopelvic region to restore normal everyday routines.   ? Personal Factors and Comorbidities Time since onset of injury/illness/exacerbation;Past/Current Experience;Fitness;Comorbidity 1   ? Comorbidities Chronic L foot and ankle pain; L talocalcaneal coalition; L subtalar distraction bone block fusion with iliac crest bone graft   ? PT Frequency 2x / week   ? PT Duration 4 weeks   ? PT Treatment/Interventions ADLs/Self Care Home Management;Cryotherapy;Electrical Stimulation;Iontophoresis 4mg /ml Dexamethasone;Moist Heat;Functional mobility training;Therapeutic activities;Therapeutic exercise;Neuromuscular re-education;Patient/family education;Manual techniques;Passive range of motion;Dry needling;Taping;Spinal Manipulations   ? PT Next Visit Plan Review HEP for proximal LE flexibility and strengthening   ? Consulted and Agree with Plan of Care Patient   ? ?  ?  ? ?  ? ? ?Patient will benefit from skilled therapeutic intervention in order to improve the following deficits and impairments:  Abnormal gait, Decreased activity tolerance, Decreased mobility, Decreased strength, Difficulty walking, Increased fascial restricitons, Increased muscle spasms, Impaired flexibility, Improper body mechanics, Postural dysfunction, Pain ? ?Visit Diagnosis: ?Sacrococcygeal disorders, not  elsewhere classified ? ?Muscle weakness (generalized) ? ? ? ? ?Problem List ?Patient Active Problem List  ? Diagnosis Date Noted  ? 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 Left ankle pain 04/15/2016  ? Hematuria 01/25/2015  ? Left foot pain 12/16/2013  ? Overweight (BMI 25.0-29.9) 11/08/2013  ? 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 Allergic rhinitis, seasonal 06/14/2011  ? S/P foot surgery, left 04/04/2011  ? ? ?Darleene Cleaver,  PTA ?05/10/2021, 11:57 AM ? ?[place] ?[facility] ?[address]  [address] ?[place], [place], [address] ?Phone: [phone]   Fax:  [phone] ? ?Name: Douglas Nelson ?MRN: [id number] ?Date of Birth: 1986-03-09 ? ? ? ?

## 2021-05-15 ENCOUNTER — Other Ambulatory Visit: Payer: Self-pay

## 2021-05-15 ENCOUNTER — Encounter: Payer: Self-pay | Admitting: Physical Therapy

## 2021-05-15 ENCOUNTER — Ambulatory Visit: Payer: Medicare HMO | Admitting: Physical Therapy

## 2021-05-15 DIAGNOSIS — M6281 Muscle weakness (generalized): Secondary | ICD-10-CM

## 2021-05-15 DIAGNOSIS — M533 Sacrococcygeal disorders, not elsewhere classified: Secondary | ICD-10-CM | POA: Diagnosis not present

## 2021-05-15 NOTE — Patient Instructions (Signed)
Access Code: [email or web address] ?URL: [email or web address] ?Date: 05/15/2021 ?Prepared by: Raynelle Fanning ? ?Exercises ?Seated Table Hamstring Stretch - 2 x daily - 7 x weekly - 2 sets - 2 reps - 30 second hold ?Seated Table Piriformis Stretch - 2 x daily - 7 x weekly - 2 sets - 2 reps - 30 sec hold ?Supine Bridge - 1 x daily - 7 x weekly - 2 sets - 10 reps ?Active Straight Leg Raise with Quad Set - 1 x daily - 7 x weekly - 2 sets - 10 reps ?Clamshell with Resistance - 1 x daily - 3 x weekly - 3 sets - 10 reps ?Active Hip Flexor Stretch - 2 x daily - 7 x weekly - 1 sets - 3 reps - 30-60 sec hold ? ?

## 2021-05-15 NOTE — Therapy (Signed)
[place] ?[facility] ?[address]  [address] ?[place], [place], [address] ?Phone: [phone]   Fax:  [phone] ? ?Physical Therapy Treatment ? ?Patient Details  ?Name: Douglas Nelson ?MRN: [id number] ?Date of Birth: 23-Mar-1985 ?Referring Provider (PT): Marcheta Grammes, DO ? ? ?Encounter Date: 05/15/2021 ? ? PT End of Session - 05/15/21 1019   ? ? Visit Number 3   ? Number of Visits 12   ? Date for PT Re-Evaluation 06/18/21   ? Authorization Type Aetna Medicare   ? PT Start Time 1019   ? PT Stop Time 1101   ? PT Time Calculation (min) 42 min   ? Activity Tolerance Patient tolerated treatment well   ? Behavior During Therapy [facility] for tasks assessed/performed   ? ?  ?  ? ?  ? ? ?Past Medical History:  ?Diagnosis Date  ? Chronic foot pain   ? ? ?Past Surgical History:  ?Procedure Laterality Date  ? SUBTALAR DISTRACTION BONE BLOCK FUSION WITH ILIAC CREST BONE GRAFT Left   ? ? ?There were no vitals filed for this visit. ? ? Subjective Assessment - 05/15/21 1022   ? ? Subjective low back was hurting last night but better today. Pt reports he has not been fully compliant with HEP since last visit.   ? Pertinent History Chronic L foot and ankle pain; L talocalcaneal coalition; L subtalar distraction bone block fusion with iliac crest bone graft   ? Patient Stated Goals "To be able to sit and lay with less discomfort"   ? Currently in Pain? No/denies   ? ?  ?  ? ?  ? ? ? ? ? ? ? ? ? ? ? ? ? ? ? ? ? ? ? ? [facility] Adult PT Treatment/Exercise - 05/15/21 0001   ? ?  ? Knee/Hip Exercises: Stretches  ? Passive Hamstring Stretch Both;2 reps;30 seconds   ? Passive Hamstring Stretch Limitations seated with leg on table   ? Hip Flexor Stretch Left;2 reps;30 seconds   ? Hip Flexor Stretch Limitations Warrior position with right SB; also done in seated position   ? Piriformis Stretch Both;2 reps;30 seconds   ? Piriformis Stretch Limitations seated pigeon pose position   R side tighter than L  ?   ? Knee/Hip Exercises: Aerobic  ? Elliptical L2 x 5 min   ?  ? Knee/Hip Exercises: Supine  ? Bridges Strengthening;Both;10 reps   ? Bridges Limitations 3 sec hold, abd brace, arms crossed   ? Straight Leg Raises Strengthening;Both;10 reps   ? Straight Leg Raises Limitations cues for ab set and slow pace   ?  ? Knee/Hip Exercises: Sidelying  ? Clams R/L 3" RTB 2x10   ?  ? Manual Therapy  ? Manual Therapy Joint mobilization   ? Joint Mobilization spring testing to sacrum; PA mobs into nutation/counternutation with mild c/o lumbar pain with nutation. UPA mobs to bil lumbar. Stiff and painful L2 through L5/S1   ? ?  ?  ? ?  ? ? ? ? ? ? ? ? ? ? PT Education - 05/15/21 1101   ? ? Education Details HEP progressed   ? Person(s) Educated Patient   ? Methods Explanation;Demonstration;Handout   ? Comprehension Verbalized understanding;Returned demonstration   ? ?  ?  ? ?  ? ? ? PT Short Term Goals - 05/10/21 1157   ? ?  ? PT SHORT TERM GOAL #1  ? Title Patient will  be independent with initial HEP   ? Status On-going   ? Target Date 05/28/21   ? ?  ?  ? ?  ? ? ? ? PT Long Term Goals - 05/10/21 1157   ? ?  ? PT LONG TERM GOAL #1  ? Title Patient will demonstrate independent use of ongoing/advanced HEP to facilitate ability to maintain/progress functional gains from skilled physical therapy services   ? Status On-going   ? Target Date 06/18/21   ?  ? PT LONG TERM GOAL #2  ? Title Patient to report reduction in frequency and intensity of coccygeal pain by >/= 50% to allow for improved positional and activity tolerance   ? Status On-going   ? Target Date 06/18/21   ?  ? PT LONG TERM GOAL #3  ? Title Patient will demonstrate improved B proximal LE strength to >/= 4+/5 for improved stability and ease of mobility   ? Status On-going   ? Target Date 06/18/21   ?  ? PT LONG TERM GOAL #4  ? Title Patient will be able to sit for at least 30 minutes for meals or riding in the car with pain level 3/10 or less   ? Status On-going   ? Target  Date 06/18/21   ?  ? PT LONG TERM GOAL #5  ? Title Patient will report no sleep disturbance due to coccygeal pain when laying in bed   ? Status On-going   ? Target Date 06/18/21   ? ?  ?  ? ?  ? ? ? ? ? ? ? ? Plan - 05/15/21 1101   ? ? Clinical Impression Statement Patient with tightness in left QL and lumbar with UPA mobs today. He also had pain in the low back with sacral nutation spring testing. Left hip flexor very tight as well. HEP progressed to address this. He would benefit from DN to left left QL/bil lumbar.   ? Personal Factors and Comorbidities Time since onset of injury/illness/exacerbation;Past/Current Experience;Fitness;Comorbidity 1   ? Comorbidities Chronic L foot and ankle pain; L talocalcaneal coalition; L subtalar distraction bone block fusion with iliac crest bone graft   ? PT Frequency 2x / week   ? PT Duration 4 weeks   ? PT Treatment/Interventions ADLs/Self Care Home Management;Cryotherapy;Electrical Stimulation;Iontophoresis 4mg /ml Dexamethasone;Moist Heat;Functional mobility training;Therapeutic activities;Therapeutic exercise;Neuromuscular re-education;Patient/family education;Manual techniques;Passive range of motion;Dry needling;Taping;Spinal Manipulations   ? PT Next Visit Plan DN/MT to left lumbar/QL; mobs prn; proximal LE flexibility and strengthening   ? PT Home Exercise Plan [facility]   ? Consulted and Agree with Plan of Care Patient   ? ?  ?  ? ?  ? ? ?Patient will benefit from skilled therapeutic intervention in order to improve the following deficits and impairments:  Abnormal gait, Decreased activity tolerance, Decreased mobility, Decreased strength, Difficulty walking, Increased fascial restricitons, Increased muscle spasms, Impaired flexibility, Improper body mechanics, Postural dysfunction, Pain ? ?Visit Diagnosis: ?Sacrococcygeal disorders, not elsewhere classified ? ?Muscle weakness (generalized) ? ? ? ? ?Problem List ?Patient Active Problem List  ? Diagnosis Date Noted  ?  [facility] syndrome 12/09/2019  ? Left ankle pain 04/15/2016  ? Hematuria 01/25/2015  ? Left foot pain 12/16/2013  ? Overweight (BMI 25.0-29.9) 11/08/2013  ? 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 Allergic rhinitis, seasonal 06/14/2011  ? S/P foot surgery, left 04/04/2011  ? ?04/06/2011, PT ?05/15/2021, 12:29 PM ? ?[place] ?[facility] ?[address] ?[place],  [place], [address] ?Phone: [phone]   Fax:  [phone] ? ?Name: Douglas M Madill ?MRN: [id number] ?Date of Birth: [birth year] ? ? ? ?

## 2021-05-16 ENCOUNTER — Other Ambulatory Visit: Payer: Self-pay | Admitting: Family Medicine

## 2021-05-16 DIAGNOSIS — G894 Chronic pain syndrome: Secondary | ICD-10-CM

## 2021-05-17 ENCOUNTER — Ambulatory Visit: Payer: Medicare HMO | Admitting: Physical Therapy

## 2021-05-17 ENCOUNTER — Other Ambulatory Visit: Payer: Self-pay

## 2021-05-17 ENCOUNTER — Encounter: Payer: Self-pay | Admitting: Physical Therapy

## 2021-05-17 ENCOUNTER — Encounter: Payer: Self-pay | Admitting: Family Medicine

## 2021-05-17 DIAGNOSIS — M6281 Muscle weakness (generalized): Secondary | ICD-10-CM | POA: Diagnosis not present

## 2021-05-17 DIAGNOSIS — M533 Sacrococcygeal disorders, not elsewhere classified: Secondary | ICD-10-CM | POA: Diagnosis not present

## 2021-05-17 MED ORDER — OXYCODONE HCL 5 MG PO TABS: 5.0000 mg | ORAL_TABLET | Freq: Three times a day (TID) | ORAL | 0 refills | Status: DC | PRN

## 2021-05-17 NOTE — Therapy (Signed)
[place] ?[facility] ?[address]  [address] ?[place], [place], [address] ?Phone: [phone]   Fax:  [phone] ? ?Physical Therapy Treatment ? ?Patient Details  ?Name: Douglas Nelson ?MRN: [id number] ?Date of Birth: 10-22-1985 ?Referring Provider (PT): Marcheta Grammes, DO ? ? ?Encounter Date: 05/17/2021 ? ? PT End of Session - 05/17/21 1109   ? ? Visit Number 4   ? Number of Visits 12   ? Date for PT Re-Evaluation 06/18/21   ? Authorization Type Aetna Medicare   ? PT Start Time 1109   ? PT Stop Time 1153   ? PT Time Calculation (min) 44 min   ? Activity Tolerance Patient tolerated treatment well   ? Behavior During Therapy [facility] for tasks assessed/performed   ? ?  ?  ? ?  ? ? ?Past Medical History:  ?Diagnosis Date  ? Chronic foot pain   ? ? ?Past Surgical History:  ?Procedure Laterality Date  ? SUBTALAR DISTRACTION BONE BLOCK FUSION WITH ILIAC CREST BONE GRAFT Left   ? ? ?There were no vitals filed for this visit. ? ? Subjective Assessment - 05/17/21 1109   ? ? Subjective EOD is when it feels worse.   ? Patient Stated Goals "To be able to sit and lay with less discomfort"   ? Currently in Pain? No/denies   ? ?  ?  ? ?  ? ? ? ? ? ? ? ? ? ? ? ? ? ? ? ? ? ? ? ? [facility] Adult PT Treatment/Exercise - 05/17/21 0001   ? ?  ? Modalities  ? Modalities Moist Heat   ?  ? Moist Heat Therapy  ? Number Minutes Moist Heat 10 Minutes   ? Moist Heat Location Lumbar Spine;Hip   left hip  ?  ? Manual Therapy  ? Manual Therapy Soft tissue mobilization   ? Manual therapy comments Skilled palpation and monitoring of soft tissues during DN   ? Soft tissue mobilization to left lumbar and gluteals   ? ?  ?  ? ?  ? ? ? Trigger Point Dry Needling - 05/17/21 0001   ? ? Consent Given? Yes   ? Education Handout Provided Previously provided   ? Muscles Treated Back/Hip Gluteus minimus;Gluteus medius;Erector spinae;Lumbar multifidi;Quadratus lumborum   ? Dry Needling Comments bil lower lumbar multifidi and  along sacral spine; left hip   ? Other Dry Needling to left obliques with twitch response and palpable decrease in tension   ? Gluteus Minimus Response Twitch response elicited;Palpable increased muscle length   ? Gluteus Medius Response Twitch response elicited;Palpable increased muscle length   ? Erector spinae Response Twitch response elicited;Palpable increased muscle length   ? Lumbar multifidi Response Twitch response elicited;Palpable increased muscle length   ? Quadratus Lumborum Response Twitch response elicited;Palpable increased muscle length   ? ?  ?  ? ?  ? ? ? ? ? ? ? ? PT Education - 05/17/21 1146   ? ? Education Details DN education and aftercare reviewed   ? Person(s) Educated Patient   ? Methods Explanation   ? Comprehension Verbalized understanding   ? ?  ?  ? ?  ? ? ? PT Short Term Goals - 05/10/21 1157   ? ?  ? PT SHORT TERM GOAL #1  ? Title Patient will be independent with initial HEP   ? Status On-going   ? Target Date 05/28/21   ? ?  ?  ? ?  ? ? ? ?  PT Long Term Goals - 05/10/21 1157   ? ?  ? PT LONG TERM GOAL #1  ? Title Patient will demonstrate independent use of ongoing/advanced HEP to facilitate ability to maintain/progress functional gains from skilled physical therapy services   ? Status On-going   ? Target Date 06/18/21   ?  ? PT LONG TERM GOAL #2  ? Title Patient to report reduction in frequency and intensity of coccygeal pain by >/= 50% to allow for improved positional and activity tolerance   ? Status On-going   ? Target Date 06/18/21   ?  ? PT LONG TERM GOAL #3  ? Title Patient will demonstrate improved B proximal LE strength to >/= 4+/5 for improved stability and ease of mobility   ? Status On-going   ? Target Date 06/18/21   ?  ? PT LONG TERM GOAL #4  ? Title Patient will be able to sit for at least 30 minutes for meals or riding in the car with pain level 3/10 or less   ? Status On-going   ? Target Date 06/18/21   ?  ? PT LONG TERM GOAL #5  ? Title Patient will report no sleep  disturbance due to coccygeal pain when laying in bed   ? Status On-going   ? Target Date 06/18/21   ? ?  ?  ? ?  ? ? ? ? ? ? ? ? Plan - 05/17/21 1151   ? ? Clinical Impression Statement Pt arrived late for appointment so we worked fully on manual therapy today. Initial trial of DN done to bil lower lumbar, left QL and obliques and left hip. Also done along lower sacral spine bil. Excellent response with marked decrease in tissue tension both with palpation and with mobility per patient. He may benefit from assessment of left gastroc/soleus muscles as trigger points here may refer to the low back as well and also the right gluteals.   ? Comorbidities Chronic L foot and ankle pain; L talocalcaneal coalition; L subtalar distraction bone block fusion with iliac crest bone graft   ? PT Treatment/Interventions ADLs/Self Care Home Management;Cryotherapy;Electrical Stimulation;Iontophoresis 4mg /ml Dexamethasone;Moist Heat;Functional mobility training;Therapeutic activities;Therapeutic exercise;Neuromuscular re-education;Patient/family education;Manual techniques;Passive range of motion;Dry needling;Taping;Spinal Manipulations   ? PT Next Visit Plan Assess DN/MT and assess left gastroc/soleus and left hip for futher treatment as well; mobs prn; proximal LE flexibility and strengthening   ? ?  ?  ? ?  ? ? ?Patient will benefit from skilled therapeutic intervention in order to improve the following deficits and impairments:  Abnormal gait, Decreased activity tolerance, Decreased mobility, Decreased strength, Difficulty walking, Increased fascial restricitons, Increased muscle spasms, Impaired flexibility, Improper body mechanics, Postural dysfunction, Pain ? ?Visit Diagnosis: ?Sacrococcygeal disorders, not elsewhere classified ? ?Muscle weakness (generalized) ? ? ? ? ?Problem List ?Patient Active Problem List  ? Diagnosis Date Noted  ? syndrome 12/09/2019  ? Left ankle pain 04/15/2016  ? Hematuria 01/25/2015  ? Left  foot pain 12/16/2013  ? Overweight (BMI 25.0-29.9) 11/08/2013  ? 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 Allergic rhinitis, seasonal 06/14/2011  ? S/P foot surgery, left 04/04/2011  ? ?04/06/2011, PT ?05/17/2021, 12:01 PM ? ?[place] ?[facility] ?[address] ?[place], [place], [place] ?Phone: [phone]   Fax:  [phone] ? ?Name: [phone] M Honsinger ?MRN: Douglas ?Date of Birth: 07-31-1985 ? ? ? ?

## 2021-05-22 ENCOUNTER — Ambulatory Visit: Payer: Medicare HMO | Admitting: Physical Therapy

## 2021-05-22 ENCOUNTER — Encounter: Payer: Self-pay | Admitting: Physical Therapy

## 2021-05-22 ENCOUNTER — Other Ambulatory Visit: Payer: Self-pay

## 2021-05-22 DIAGNOSIS — M533 Sacrococcygeal disorders, not elsewhere classified: Secondary | ICD-10-CM

## 2021-05-22 DIAGNOSIS — M6281 Muscle weakness (generalized): Secondary | ICD-10-CM

## 2021-05-22 NOTE — Therapy (Signed)
?[facility] ?[place] ?[place], [place], [address] ?Phone: [phone]   Fax:  [phone] ? ?Physical Therapy Treatment ? ?Patient Details  ?Name: Douglas Nelson ?MRN: [id number] ?Date of Birth: 02/12/1986 ?Referring Provider (PT): Audelia Acton, DO ? ? ?Encounter Date: 05/22/2021 ? ? PT End of Session - 05/22/21 1109   ? ? Visit Number 5   ? Number of Visits 12   ? Date for PT Re-Evaluation 06/18/21   ? Authorization Type A[employer] Medicare   ? PT Start Time 1109   Pt arrived late  ? PT Stop Time 1147   ? PT Time Calculation (min) 38 min   ? Activity Tolerance Patient tolerated treatment well   ? Behavior During Therapy [facility] for tasks assessed/performed   ? ?  ?  ? ?  ? ? ?Past Medical History:  ?Diagnosis Date  ? Chronic foot pain   ? ? ?Past Surgical History:  ?Procedure Laterality Date  ? SUBTALAR DISTRACTION BONE BLOCK FUSION WITH ILIAC CREST BONE GRAFT Left   ? ? ?There were no vitals filed for this visit. ? ? Subjective Assessment - 05/22/21 1111   ? ? Subjective Pt reports pain usually not as much this early in the day but he has been busier the past few days.   ? Pertinent History Chronic L foot and ankle pain; L talocalcaneal coalition; L subtalar distraction bone block fusion with iliac crest bone graft   ? Patient Stated Goals "To be able to sit and lay with less discomfort"   ? Currently in Pain? Yes   ? Pain Score 4    ? Pain Location Coccyx   ? Pain Orientation Lower   ? Pain Descriptors / Indicators Dull;Aching   ? Pain Type Acute pain;Chronic pain   ? ?  ?  ? ?  ? ? ? ? ? ? ? ? ? ? ? ? ? ? ? ? ? ? ? ? [place] Adult PT Treatment/Exercise - 05/22/21 1109   ? ?  ? Knee/Hip Exercises: Stretches  ? Gastroc Stretch Left;2 reps;30 seconds   ? Gastroc Stretch Limitations runner's stretch at wall   ? Soleus Stretch Left;1 rep;30 seconds   ? Soleus Stretch Limitations runner's stretch at wall   pt noting more discomfort d/t limited ROM s/p ankle  fusion  ? Other Knee/Hip Stretches L peroneal stretch in runner's stretch position with folded towel under medial foot 2 x 30 sec   ?  ? Knee/Hip Exercises: Aerobic  ? Recumbent Bike L4 x 6 min   ?  ? Manual Therapy  ? Manual Therapy Soft tissue mobilization   ? Manual therapy comments Skilled palpation and monitoring of soft tissues during DN   ? Soft tissue mobilization to L gastroc/soleus and peroneals   ? ?  ?  ? ?  ? ? ? [place] Dry Needling - 05/22/21 1109   ? ? Consent Given? Yes   ? Education Handout Provided Previously provided   ? Muscles Treated Lower Quadrant Gastrocnemius;Soleus;Peroneals   ? Dry Needling Comments left   ? Peroneals Response Twitch response elicited;Palpable increased muscle length   longus & brevis  ? Gastrocnemius Response Twitch response elicited;Palpable increased muscle length   lateral  ? Soleus Response Twitch response elicited;Palpable increased muscle length   ? ?  ?  ? ?  ? ? ? ? ? ? ? ? ? ? PT Short Term Goals - 05/22/21  1114   ? ?  ? PT SHORT TERM GOAL #1  ? Title Patient will be independent with initial HEP   ? Status Achieved   05/22/21  ? ?  ?  ? ?  ? ? ? ? PT Long Term Goals - 05/10/21 1157   ? ?  ? PT LONG TERM GOAL #1  ? Title Patient will demonstrate independent use of ongoing/advanced HEP to facilitate ability to maintain/progress functional gains from skilled physical therapy services   ? Status On-going   ? Target Date 06/18/21   ?  ? PT LONG TERM GOAL #2  ? Title Patient to report reduction in frequency and intensity of coccygeal pain by >/= 50% to allow for improved positional and activity tolerance   ? Status On-going   ? Target Date 06/18/21   ?  ? PT LONG TERM GOAL #3  ? Title Patient will demonstrate improved B proximal LE strength to >/= 4+/5 for improved stability and ease of mobility   ? Status On-going   ? Target Date 06/18/21   ?  ? PT LONG TERM GOAL #4  ? Title Patient will be able to sit for at least 30 minutes for meals or riding in the car with  pain level 3/10 or less   ? Status On-going   ? Target Date 06/18/21   ?  ? PT LONG TERM GOAL #5  ? Title Patient will report no sleep disturbance due to coccygeal pain when laying in bed   ? Status On-going   ? Target Date 06/18/21   ? ?  ?  ? ?  ? ? ? ? ? ? ? ? Plan - 05/22/21 1114   ? ? Clinical Impression Statement Douglas reports DN alleviated some pain and tightness in the surrounding areas but not as much directly over the coccyx. L gastroc/soleus and peroneals noted to have taut bands and TPs which were addressed with MT incorporating DN - strong twitch responses elicited resulting in palpable reduction in muscle tension and pt reporting improved weight bearing/walking tolerance on L foot. Provided instruction in gastroc, soleus and peroneal stretches but limited tolerance noted for soleus stretch due to limited ankle ROM s/p fusion. He reports HEP going well with no concerns at this time - STG met. Time limited due to pt?s late arrival but will plan to resume lumbopelvic and LE stretching and strengthening exercises along with continued MT as needed in upcoming visits.   ? Comorbidities Chronic L foot and ankle pain; L talocalcaneal coalition; L subtalar distraction bone block fusion with iliac crest bone graft   ? Rehab Potential Good   ? PT Frequency 2x / week   ? PT Duration 4 weeks   ? PT Treatment/Interventions ADLs/Self Care Home Management;Cryotherapy;Electrical Stimulation;Iontophoresis 52m/ml Dexamethasone;Moist Heat;Functional mobility training;Therapeutic activities;Therapeutic exercise;Neuromuscular re-education;Patient/family education;Manual techniques;Passive range of motion;Dry needling;Taping;Spinal Manipulations   ? PT Next Visit Plan Assess response to DN/MT to left gastroc/soleusperoneals; proximal LE flexibility and strengthening; continued MT +/- DN for lumbosacral paraspinals and L/R hip for futher treatment as indicated; mobs prn   ? [facility]  ? Consulted and  Agree with Plan of Care Patient   ? ?  ?  ? ?  ? ? ?Patient will benefit from skilled therapeutic intervention in order to improve the following deficits and impairments:  Abnormal gait, Decreased activity tolerance, Decreased mobility, Decreased strength, Difficulty walking, Increased fascial restricitons, Increased muscle spasms, Impaired flexibility, Improper body mechanics,  Postural dysfunction, Pain ? ?Visit Diagnosis: ?Sacrococcygeal disorders, not elsewhere classified ? ?Muscle weakness (generalized) ? ? ? ? ?Problem List ?Patient Active Problem List  ? Diagnosis Date Noted  ? 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 Left ankle pain 04/15/2016  ? Hematuria 01/25/2015  ? Left foot pain 12/16/2013  ? Overweight (BMI 25.0-29.9) 11/08/2013  ? 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 Allergic rhinitis, seasonal 06/14/2011  ? S/P foot surgery, left 04/04/2011  ? ? ?Percival Spanish, PT ?05/22/2021, 12:07 PM ? ?[place] ?[facility] ?[place] ?[place], [place], [address] ?Phone: [phone]   Fax:  [phone] ? ?Name: Douglas Nelson ?MRN: [id number] ?Date of Birth: 28-Apr-1985 ? ? ? ?

## 2021-05-24 ENCOUNTER — Other Ambulatory Visit: Payer: Self-pay

## 2021-05-24 ENCOUNTER — Ambulatory Visit: Payer: Medicare HMO | Admitting: Physical Therapy

## 2021-05-24 ENCOUNTER — Encounter: Payer: Self-pay | Admitting: Physical Therapy

## 2021-05-24 DIAGNOSIS — M533 Sacrococcygeal disorders, not elsewhere classified: Secondary | ICD-10-CM | POA: Diagnosis not present

## 2021-05-24 DIAGNOSIS — M6281 Muscle weakness (generalized): Secondary | ICD-10-CM | POA: Diagnosis not present

## 2021-05-24 NOTE — Patient Instructions (Signed)
Access Code: [email or web address] ?URL: [email or web address] ?Date: 05/24/2021 ?Prepared by: Raynelle Fanning ? ?Exercises ?Seated Table Hamstring Stretch - 2 x daily - 7 x weekly - 2 sets - 2 reps - 30 second hold ?Seated Table Piriformis Stretch - 2 x daily - 7 x weekly - 2 sets - 2 reps - 30 sec hold ?Supine Bridge - 1 x daily - 7 x weekly - 2 sets - 10 reps ?Active Straight Leg Raise with Quad Set - 1 x daily - 7 x weekly - 2 sets - 10 reps ?Clamshell with Resistance - 1 x daily - 3 x weekly - 3 sets - 10 reps ?Active Hip Flexor Stretch - 2 x daily - 7 x weekly - 1 sets - 3 reps - 30-60 sec hold ?Hip Abduction with Resistance Loop - 1 x daily - 3 x weekly - 3 sets - 10 reps ?Hip Extension with Resistance Loop - 1 x daily - 3 x weekly - 3 sets - 10 reps ?Standing Hip Adduction with Resistance - 1 x daily - 3 x weekly - 3 sets - 10 reps ?Standing Hip Flexion March - 1 x daily - 3 x weekly - 3 sets - 10 reps ?Bird Dog - 1 x daily - 3 x weekly - 3 sets - 10 reps - 5 sec hold ?Standing Single Leg Hip ER - 1 x daily - 3 x weekly - 3 sets - 10 reps ?Seated Hip Internal Rotation with Ball and Resistance - 1 x daily - 3 x weekly - 3 sets - 10 reps ? ?

## 2021-05-24 NOTE — Therapy (Signed)
[place] ?[facility] ?[place] ?[place], [place], [address] ?Phone: [phone]   Fax:  [phone] ? ?Physical Therapy Treatment ? ?Patient Details  ?Name: Douglas Nelson ?MRN: [id number] ?Date of Birth: September 07, 1985 ?Referring Provider (PT): Audelia Acton, DO ? ? ?Encounter Date: 05/24/2021 ? ? PT End of Session - 05/24/21 1105   ? ? Visit Number 6   ? PT Start Time 1104   ? PT Stop Time 1148   ? PT Time Calculation (min) 44 min   ? Activity Tolerance Patient tolerated treatment well   ? Behavior During Therapy [facility] for tasks assessed/performed   ? ?  ?  ? ?  ? ? ?Past Medical History:  ?Diagnosis Date  ? Chronic foot pain   ? ? ?Past Surgical History:  ?Procedure Laterality Date  ? SUBTALAR DISTRACTION BONE BLOCK FUSION WITH ILIAC CREST BONE GRAFT Left   ? ? ?There were no vitals filed for this visit. ? ? Subjective Assessment - 05/24/21 1107   ? ? Subjective Pt reporting relief in calf since DN. Has been able to stand more evenly without trying to weight shift off the L LE as much. No significant change in sacral pain.   ? Pertinent History Chronic L foot and ankle pain; L talocalcaneal coalition; L subtalar distraction bone block fusion with iliac crest bone graft   ? Patient Stated Goals "To be able to sit and lay with less discomfort"   ? Currently in Pain? Yes   ? Pain Score 3    ? Pain Location Coccyx   ? Pain Orientation Lower   ? Pain Descriptors / Indicators Aching   ? ?  ?  ? ?  ? ? ? ? ? ? ? ? ? ? ? ? ? ? ? ? ? ? ? ? [place] Adult PT Treatment/Exercise - 05/24/21 0001   ? ?  ? Knee/Hip Exercises: Aerobic  ? Elliptical L2 x 5 min   ?  ? Knee/Hip Exercises: Standing  ? Hip Flexion Both;10 reps;Knee bent   ? Hip Flexion Limitations RTB under feet   ? Hip ADduction Both;10 reps   ? Hip ADduction Limitations at ankles; more PNF diagonal   ? Hip Abduction Both;10 reps;Knee straight   ? Abduction Limitations RTB   ? Hip Extension Both;10 reps   ? Extension  Limitations RTB   ? Functional Squat 15 reps   ? Functional Squat Limitations to mat table   ? Other Standing Knee Exercises hip ER (soccer like kick) RTB x 10 ea   ?  ? Knee/Hip Exercises: Seated  ? Other Seated Knee/Hip Exercises bil hip IR RTB ball between knees x 10 ea   ?  ? Knee/Hip Exercises: Supine  ? Bridges with Clamshell Both;10 reps   GTB  ?  ? Knee/Hip Exercises: Sidelying  ? Clams GTB x 10 ea   ?  ? Knee/Hip Exercises: Prone  ? Other Prone Exercises Quadriped: alt legs x 10 ea; alt opp arm/leg x 10 ea   ? ?  ?  ? ?  ? ? ? ? ? ? ? ? ? ? PT Education - 05/24/21 1156   ? ? Education Details HEP progressed   ? Person(s) Educated Patient   ? Methods Explanation;Demonstration;Handout   ? Comprehension Verbalized understanding;Returned demonstration   ? ?  ?  ? ?  ? ? ? PT Short Term Goals - 05/22/21 1114   ? ?  ?  PT SHORT TERM GOAL #1  ? Title Patient will be independent with initial HEP   ? Status Achieved   05/22/21  ? ?  ?  ? ?  ? ? ? ? PT Long Term Goals - 05/10/21 1157   ? ?  ? PT LONG TERM GOAL #1  ? Title Patient will demonstrate independent use of ongoing/advanced HEP to facilitate ability to maintain/progress functional gains from skilled physical therapy services   ? Status On-going   ? Target Date 06/18/21   ?  ? PT LONG TERM GOAL #2  ? Title Patient to report reduction in frequency and intensity of coccygeal pain by >/= 50% to allow for improved positional and activity tolerance   ? Status On-going   ? Target Date 06/18/21   ?  ? PT LONG TERM GOAL #3  ? Title Patient will demonstrate improved B proximal LE strength to >/= 4+/5 for improved stability and ease of mobility   ? Status On-going   ? Target Date 06/18/21   ?  ? PT LONG TERM GOAL #4  ? Title Patient will be able to sit for at least 30 minutes for meals or riding in the car with pain level 3/10 or less   ? Status On-going   ? Target Date 06/18/21   ?  ? PT LONG TERM GOAL #5  ? Title Patient will report no sleep disturbance due to coccygeal  pain when laying in bed   ? Status On-going   ? Target Date 06/18/21   ? ?  ?  ? ?  ? ? ? ? ? ? ? ? Plan - 05/24/21 1154   ? ? Clinical Impression Statement Douglas reports significant relief in the calf with the DN and he is able to bear more weight on the left side. We focused on strenghthening today and HEP was progressed.   ? PT Treatment/Interventions ADLs/Self Care Home Management;Cryotherapy;Electrical Stimulation;Iontophoresis 4mg /ml Dexamethasone;Moist Heat;Functional mobility training;Therapeutic activities;Therapeutic exercise;Neuromuscular re-education;Patient/family education;Manual techniques;Passive range of motion;Dry needling;Taping;Spinal Manipulations   ? PT Next Visit Plan proximal LE flexibility and strengthening; continued MT +/- DN for lumbosacral paraspinals and L/R hip for futher treatment as indicated; mobs prn   ? PT Home Exercise Plan [email or web address]   ? ?  ?  ? ?  ? ? ?Patient will benefit from skilled therapeutic intervention in order to improve the following deficits and impairments:  Abnormal gait, Decreased activity tolerance, Decreased mobility, Decreased strength, Difficulty walking, Increased fascial restricitons, Increased muscle spasms, Impaired flexibility, Improper body mechanics, Postural dysfunction, Pain ? ?Visit Diagnosis: ?Sacrococcygeal disorders, not elsewhere classified ? ?Muscle weakness (generalized) ? ? ? ? ?Problem List ?Patient Active Problem List  ? Diagnosis Date Noted  ? 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 Left ankle pain 04/15/2016  ? Hematuria 01/25/2015  ? Left foot pain 12/16/2013  ? Overweight (BMI 25.0-29.9) 11/08/2013  ? 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 Allergic rhinitis, seasonal 06/14/2011  ? S/P foot surgery, left 04/04/2011  ? ?Madelyn Flavors, PT ?05/24/2021, 11:58 AM ? ?[place] ?[facility] ?[place] ?[place], [place], [address] ?Phone: [phone]   Fax:  [phone] ? ?Name: Douglas Nelson ?MRN:  [id number] ?Date of Birth: 05-22-1985 ? ? ? ?

## 2021-05-29 ENCOUNTER — Other Ambulatory Visit: Payer: Self-pay

## 2021-05-29 ENCOUNTER — Ambulatory Visit: Payer: Medicare HMO

## 2021-05-29 DIAGNOSIS — M533 Sacrococcygeal disorders, not elsewhere classified: Secondary | ICD-10-CM | POA: Diagnosis not present

## 2021-05-29 DIAGNOSIS — M6281 Muscle weakness (generalized): Secondary | ICD-10-CM | POA: Diagnosis not present

## 2021-05-29 NOTE — Therapy (Signed)
[place] ?[facility] ?[address]  [address] ?[place], [place], [address] ?Phone: [phone]   Fax:  [phone] ? ?Physical Therapy Treatment ? ?Patient Details  ?Name: Douglas Nelson ?MRN: [id number] ?Date of Birth: 1985-05-06 ?Referring Provider (PT): Marcheta Grammes, DO ? ? ?Encounter Date: 05/29/2021 ? ? PT End of Session - 05/29/21 0936   ? ? Visit Number 7   ? Number of Visits 12   ? Date for PT Re-Evaluation 06/18/21   ? Authorization Type Aetna Medicare   ? PT Start Time [phone]   pt late  ? PT Stop Time 0936   ? PT Time Calculation (min) 41 min   ? Activity Tolerance Patient tolerated treatment well   ? Behavior During Therapy [facility] for tasks assessed/performed   ? ?  ?  ? ?  ? ? ?Past Medical History:  ?Diagnosis Date  ? Chronic foot pain   ? ? ?Past Surgical History:  ?Procedure Laterality Date  ? SUBTALAR DISTRACTION BONE BLOCK FUSION WITH ILIAC CREST BONE GRAFT Left   ? ? ?There were no vitals filed for this visit. ? ? Subjective Assessment - 05/29/21 0857   ? ? Subjective Yesterday I was sitting a lot which always seems to flare me up.   ? Pertinent History Chronic L foot and ankle pain; L talocalcaneal coalition; L subtalar distraction bone block fusion with iliac crest bone graft   ? Patient Stated Goals "To be able to sit and lay with less discomfort"   ? Currently in Pain? Yes   ? Pain Score 4    ? Pain Location Coccyx   ? Pain Orientation Lower   ? Pain Descriptors / Indicators Aching   ? ?  ?  ? ?  ? ? ? ? ? ? ? ? ? ? ? ? ? ? ? ? ? ? ? ? [facility] Adult PT Treatment/Exercise - 05/29/21 0001   ? ?  ? Knee/Hip Exercises: Aerobic  ? Recumbent Bike L4 x 4 min   ?  ? Knee/Hip Exercises: Standing  ? Other Standing Knee Exercises church pews 3x10   ? Other Standing Knee Exercises retro step alt x 10   ?  ? Knee/Hip Exercises: Supine  ? Bridges with [place]acher May Strengthening;Both;2 sets;10 reps   ?  ? Manual Therapy  ? Manual Therapy Soft tissue mobilization   ? Soft  tissue mobilization to L gastroc/soleus and peroneals, tib ant   ? ?  ?  ? ?  ? ? ? ? ? ? ? ? ? ? ? ? PT Short Term Goals - 05/22/21 1114   ? ?  ? PT SHORT TERM GOAL #1  ? Title Patient will be independent with initial HEP   ? Status Achieved   05/22/21  ? ?  ?  ? ?  ? ? ? ? PT Long Term Goals - 05/10/21 1157   ? ?  ? PT LONG TERM GOAL #1  ? Title Patient will demonstrate independent use of ongoing/advanced HEP to facilitate ability to maintain/progress functional gains from skilled physical therapy services   ? Status On-going   ? Target Date 06/18/21   ?  ? PT LONG TERM GOAL #2  ? Title Patient to report reduction in frequency and intensity of coccygeal pain by >/= 50% to allow for improved positional and activity tolerance   ? Status On-going   ? Target Date 06/18/21   ?  ? PT LONG  TERM GOAL #3  ? Title Patient will demonstrate improved B proximal LE strength to >/= 4+/5 for improved stability and ease of mobility   ? Status On-going   ? Target Date 06/18/21   ?  ? PT LONG TERM GOAL #4  ? Title Patient will be able to sit for at least 30 minutes for meals or riding in the car with pain level 3/10 or less   ? Status On-going   ? Target Date 06/18/21   ?  ? PT LONG TERM GOAL #5  ? Title Patient will report no sleep disturbance due to coccygeal pain when laying in bed   ? Status On-going   ? Target Date 06/18/21   ? ?  ?  ? ?  ? ? ? ? ? ? ? ? Plan - 05/29/21 0936   ? ? Clinical Impression Statement Today focused on STM to the L ankle as this throws off the patient's gait. He did report relief in the lower leg and I believe that more relief he gets in the lower leg this will help his gait and decrease his compensating with the R hip. Also worked on [employer] exercises to help increase weight acceptance on the L LE. Cues needed with church pew to keep feet flat on ground and for proper WS.   ? Personal Factors and Comorbidities Time since onset of injury/illness/exacerbation;Past/Current Experience;Fitness;Comorbidity 1   ?  Comorbidities Chronic L foot and ankle pain; L talocalcaneal coalition; L subtalar distraction bone block fusion with iliac crest bone graft   ? PT Frequency 2x / week   ? PT Duration 4 weeks   ? PT Treatment/Interventions ADLs/Self Care Home Management;Cryotherapy;Electrical Stimulation;Iontophoresis 4mg /ml Dexamethasone;Moist Heat;Functional mobility training;Therapeutic activities;Therapeutic exercise;Neuromuscular re-education;Patient/family education;Manual techniques;Passive range of motion;Dry needling;Taping;Spinal Manipulations   ? PT Next Visit Plan proximal LE flexibility and strengthening; continued MT +/- DN for lumbosacral paraspinals and L/R hip for futher treatment as indicated; mobs prn   ? PT Home Exercise Plan [facility]   ? Consulted and Agree with Plan of Care Patient   ? ?  ?  ? ?  ? ? ?Patient will benefit from skilled therapeutic intervention in order to improve the following deficits and impairments:  Abnormal gait, Decreased activity tolerance, Decreased mobility, Decreased strength, Difficulty walking, Increased fascial restricitons, Increased muscle spasms, Impaired flexibility, Improper body mechanics, Postural dysfunction, Pain ? ?Visit Diagnosis: ?Sacrococcygeal disorders, not elsewhere classified ? ?Muscle weakness (generalized) ? ? ? ? ?Problem List ?Patient Active Problem List  ? Diagnosis Date Noted  ? [facility] syndrome 12/09/2019  ? Left ankle pain 04/15/2016  ? Hematuria 01/25/2015  ? Left foot pain 12/16/2013  ? Overweight (BMI 25.0-29.9) 11/08/2013  ? 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 Allergic rhinitis, seasonal 06/14/2011  ? S/P foot surgery, left 04/04/2011  ? ? ?04/06/2011, PTA ?05/29/2021, 10:31 AM ? ?Douglas Nelson ?[facility] ?[address] ?[place], [place], [place] ?Phone: [phone]   Fax:  [phone] ? ?Name: Douglas Nelson ?MRN: [id number] ?Date of Birth: 05-30-1985 ? ? ? ?

## 2021-05-31 ENCOUNTER — Ambulatory Visit: Payer: Medicare HMO

## 2021-06-05 ENCOUNTER — Encounter: Payer: Self-pay | Admitting: Physical Therapy

## 2021-06-05 ENCOUNTER — Ambulatory Visit: Payer: Medicare HMO | Admitting: Physical Therapy

## 2021-06-05 ENCOUNTER — Other Ambulatory Visit: Payer: Self-pay

## 2021-06-05 DIAGNOSIS — M6281 Muscle weakness (generalized): Secondary | ICD-10-CM | POA: Diagnosis not present

## 2021-06-05 DIAGNOSIS — M533 Sacrococcygeal disorders, not elsewhere classified: Secondary | ICD-10-CM

## 2021-06-05 NOTE — Therapy (Signed)
[facility] [place] ?[address]  [address] ?[place], [place], [address] ?Phone: [phone]   Fax:  [phone] ? ?Physical Therapy Treatment / Recert ? ?Patient Details  ?Name: Douglas Nelson ?MRN: [id number] ?Date of Birth: 12/01/1985 ?Referring Provider (PT): Nicolas Wendling, DO ? ? ? ?Progress Note ? ?Reporting Period 05/07/2021 to 06/05/2021 ? ?See note below for Objective Data and Assessment of Progress/Goals.  ? ? ? ?Encounter Date: 06/05/2021 ? ? PT End of Session - 06/05/21 0940   ? ? Visit Number 8   ? Number of Visits 20   ? Date for PT Re-Evaluation 07/17/21   ? Authorization Type [employer] Medicare   ? Progress Note Due on Visit 18   Recert on visit #8 - 06/05/21  ? PT Start Time 0938   Pt arrived late  ? PT Stop Time 1017   ? PT Time Calculation (min) 39 min   ? Activity Tolerance Patient tolerated treatment well   ? Behavior During Therapy WFL for tasks assessed/performed   ? ?  ?  ? ?  ? ? ?Past Medical History:  ?Diagnosis Date  ? Chronic foot pain   ? ? ?Past Surgical History:  ?Procedure Laterality Date  ? SUBTALAR DISTRACTION BONE BLOCK FUSION WITH ILIAC CREST BONE GRAFT Left   ? ? ?There were no vitals filed for this visit. ? ? Subjective Assessment - 06/05/21 0940   ? ? Subjective Pt reports increased pain last night and feels like the cyst/lump at the tip of the coccyx is maybe a little bit larger. Has had a mix of very busy days and then days where he has been sitting for extended periods so not sure which may have been the trigger.   ? Pertinent History Chronic L foot and ankle pain; L talocalcaneal coalition; L subtalar distraction bone block fusion with iliac crest bone graft   ? How long can you sit comfortably? 20-30 minutes - due to tailbone   ? Patient Stated Goals "To be able to sit and lay with less discomfort"   ? Currently in Pain? Yes   ? Pain Score 4    ? Pain Location Coccyx   ? Pain Orientation Lower   ? Pain Descriptors / Indicators  Discomfort   ? Pain Type Acute pain;Chronic pain   ? Pain Frequency Constant   ? ?  ?  ? ?  ? ? ? ? ? [facility] PT Assessment - 06/05/21 0938   ? ?  ? Assessment  ? Medical Diagnosis Chronic SI joint pain   ? Referring Provider (PT) Nicolas Wendling, DO   ? Onset Date/Surgical Date --   chronic for a couple years with exacerbation over the past 2 months  ? Next MD Visit none   ?  ? Prior Function  ? Level of Independence Independent   ? Vocation Part time employment   ? 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 Leisure reading, chess, exercise a few times per week - recumbent bike, UB weights/bands, pull-up bar (limited lower body due to L foot/ankle)   ?  ? Strength  ? Right Hip Flexion 4+/5   ? Right Hip Extension 4-/5   ? Right Hip External Rotation  4+/5   ? Right Hip Internal Rotation 4+/5   ? Right Hip ABduction 4-/5   ? Right Hip ADduction 4/5   ? Left Hip Flexion 4/5   ? Left Hip Extension 4-/5   ?   Left Hip External Rotation 4+/5   ? Left Hip Internal Rotation 4+/5   ? Left Hip ABduction 4-/5   ? Left Hip ADduction 4-/5   ? Right Knee Flexion 5/5   ? Right Knee Extension 4+/5   ? Left Knee Flexion 5/5   ? Left Knee Extension 4+/5   ? ?  ?  ? ?  ? ? ? ? ? ? ? ? ? ? ? ? ? ? ? ? [facility] Adult PT Treatment/Exercise - 06/05/21 0938   ? ?  ? Knee/Hip Exercises: Aerobic  ? Recumbent Bike L4 x 6 min   ?  ? Knee/Hip Exercises: Supine  ? Bridges with Clamshell Both;10 reps   GTB  ?  ? Knee/Hip Exercises: Sidelying  ? Clams GTB x 10 ea L/R in side plank elbow to knee   ? ?  ?  ? ?  ? ? ? ? ? ? ? ? ? ? PT Education - 06/05/21 1016   ? ? Education Details HEP reviewed & progressed - full HEP reprinted at pt request along with MedBridgeGO access information   ? Person(s) Educated Patient   ? Methods Explanation;Demonstration;Verbal cues;Handout   ? Comprehension Verbalized understanding;Verbal cues required;Returned demonstration;Need further instruction   ? ?  ?  ? ?  ? ? ? PT Short Term Goals - 05/22/21 1114   ? ?   ? PT SHORT TERM GOAL #1  ? Title Patient will be independent with initial HEP   ? Status Achieved   05/22/21  ? ?  ?  ? ?  ? ? ? ? PT Long Term Goals - 06/05/21 0940   ? ?  ? PT LONG TERM GOAL #1  ? Title Patient will demonstrate independent use of ongoing/advanced HEP to facilitate ability to maintain/progress functional gains from skilled physical therapy services   ? Status Partially Met   ? Target Date 07/17/21   ?  ? PT LONG TERM GOAL #2  ? Title Patient to report reduction in frequency and intensity of coccygeal pain by >/= 50% to allow for improved positional and activity tolerance   ? Status On-going   06/04/21 - Pt reports 10% improvement in pain  ? Target Date 07/17/21   ?  ? PT LONG TERM GOAL #3  ? Title Patient will demonstrate improved B proximal LE strength to >/= 4+/5 for improved stability and ease of mobility   ? Status Partially Met   ? Target Date 07/17/21   ?  ? PT LONG TERM GOAL #4  ? Title Patient will be able to sit for at least 30 minutes for meals or riding in the car with pain level 3/10 or less   ? Status Partially Met   06/05/21 - sitting tolerance up to 20-30 minutes  ? Target Date 07/17/21   ?  ? PT LONG TERM GOAL #5  ? Title Patient will report no sleep disturbance due to coccygeal pain when laying in bed   ? Status On-going   ? Target Date 07/17/21   ? ?  ?  ? ?  ? ? ? ? ? ? ? ? Plan - 06/05/21 1017   ? ? Clinical Impression Statement Douglas Nelson reports 10% improvement in coccygeal pain since start of PT, with sitting tolerance improved to 20-30 minutes but sleep still limited due to discomfort with laying down in bed. Proximal LE strength gradually improving but still with ongoing B hip weakness. HEP reviewed and updated, progressing   exercises to target ongoing weakness identified on MMT - pt noting increased effort necessary with exercise advancement but no increased pain. Douglas Nelson progressing toward goals, but will continue to benefit from skilled PT to promote further proximal LE  strengthening for increased pelvic support and stability for improved activity and positional tolerance, hence will recommend recert for additional 2x/wk for up to 6 weeks.   ? Comorbidities Chronic L foot and ankle pain; L talocalcaneal coalition; L subtalar distraction bone block fusion with iliac crest bone graft   ? Rehab Potential Good   ? PT Frequency 2x / week   ? PT Duration 6 weeks   ? PT Treatment/Interventions ADLs/Self Care Home Management;Cryotherapy;Electrical Stimulation;Iontophoresis 4mg/ml Dexamethasone;Moist Heat;Functional mobility training;Therapeutic activities;Therapeutic exercise;Neuromuscular re-education;Patient/family education;Manual techniques;Passive range of motion;Dry needling;Taping;Spinal Manipulations   ? PT Next Visit Plan proximal LE flexibility and strengthening; continued MT +/- DN for lumbosacral paraspinals and L/R hip for further treatment as indicated; mobs prn   ? PT Home Exercise Plan [email or web address]   ? Consulted and Agree with Plan of Care Patient   ? ?  ?  ? ?  ? ? ?Patient will benefit from skilled therapeutic intervention in order to improve the following deficits and impairments:  Abnormal gait, Decreased activity tolerance, Decreased mobility, Decreased strength, Difficulty walking, Increased fascial restricitons, Increased muscle spasms, Impaired flexibility, Improper body mechanics, Postural dysfunction, Pain ? ?Visit Diagnosis: ?Sacrococcygeal disorders, not elsewhere classified ? ?Muscle weakness (generalized) ? ? ? ? ?Problem List ?Patient Active Problem List  ? Diagnosis Date Noted  ? Gilbert syndrome 12/09/2019  ? Left ankle pain 04/15/2016  ? Hematuria 01/25/2015  ? Left foot pain 12/16/2013  ? Overweight (BMI 25.0-29.9) 11/08/2013  ? 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 Allergic rhinitis, seasonal 06/14/2011  ? S/P foot surgery, left 04/04/2011  ? ? ?JoAnne M Kreis, PT ?06/05/2021, 12:42 PM ? ?[facility] [place] ?[address]  [address] ?[place], [place], [address] ?Phone: [phone]   Fax:  [phone] ? ?Name: Douglas Nelson ?MRN: [id number] ?Date of Birth: 06/06/1985 ? ? ? ?

## 2021-06-05 NOTE — Patient Instructions (Addendum)
? ? ? ? ?  Access Code: [email or web address] ?URL: [email or web address] ?Date: 06/05/2021 ?Prepared by: Glenetta Hew ? ?Exercises ?- Seated Table Hamstring Stretch  - 2 x daily - 7 x weekly - 2 sets - 2 reps - 30 second hold ?- Seated Table Piriformis Stretch  - 2 x daily - 7 x weekly - 2 sets - 2 reps - 30 sec hold ?- Active Hip Flexor Stretch  - 2 x daily - 7 x weekly - 1 sets - 3 reps - 30-60 sec hold ?- Hip Abduction with Resistance Loop  - 1 x daily - 3 x weekly - 3 sets - 10 reps ?- Hip Extension with Resistance Loop  - 1 x daily - 3 x weekly - 3 sets - 10 reps ?- Standing Hip Adduction with Resistance  - 1 x daily - 3 x weekly - 3 sets - 10 reps ?- Marching with Resistance  - 1 x daily - 3 x weekly - 2 sets - 10 reps - 3 sec hold ?- Bird Dog  - 1 x daily - 3 x weekly - 3 sets - 10 reps - 5 sec hold ?- Standing Single Leg Hip ER   - 1 x daily - 3 x weekly - 3 sets - 10 reps ?- Seated Hip Internal Rotation with Ball and Resistance  - 1 x daily - 3 x weekly - 3 sets - 10 reps ?- Bridge with Hip Abduction and Resistance  - 1 x daily - 3 x weekly - 2 sets - 10 reps - 5 sec hold ?- Side Plank with Clam and Resistance  - 1 x daily - 3 x weekly - 2 sets - 10 reps - 3 sec hold ? ?

## 2021-06-13 ENCOUNTER — Other Ambulatory Visit: Payer: Self-pay

## 2021-06-13 DIAGNOSIS — G894 Chronic pain syndrome: Secondary | ICD-10-CM

## 2021-06-13 MED ORDER — OXYCODONE HCL 5 MG PO TABS: 5.0000 mg | ORAL_TABLET | Freq: Three times a day (TID) | ORAL | 0 refills | Status: DC | PRN

## 2021-06-13 NOTE — Telephone Encounter (Signed)
Pt called requesting refill of Oxycodone. Pt has appt scheduled for 06/27/2021 ?

## 2021-06-14 ENCOUNTER — Ambulatory Visit (INDEPENDENT_AMBULATORY_CARE_PROVIDER_SITE_OTHER): Payer: Medicare HMO

## 2021-06-14 VITALS — Ht 72.0 in | Wt 185.0 lb

## 2021-06-14 DIAGNOSIS — Z0000 Encounter for general adult medical examination without abnormal findings: Secondary | ICD-10-CM

## 2021-06-14 NOTE — Progress Notes (Signed)
?I connected with Douglas Bir today by telephone and verified that I am speaking with the correct person using two identifiers. ?Location patient: home ?Location provider: work ?Persons participating in the virtual visit: Douglas Nelson, Leanda Padmore LPN. ?  ?I discussed the limitations, risks, security and privacy concerns of performing an evaluation and management service by telephone and the availability of in person appointments. I also discussed with the patient that there may be a patient responsible charge related to this service. The patient expressed understanding and verbally consented to this telephonic visit.  ?  ?Interactive audio and video telecommunications were attempted between this provider and patient, however failed, due to patient having technical difficulties OR patient did not have access to video capability.  We continued and completed visit with audio only. ? ?  ? ?Vital signs may be patient reported or missing. ? ?Subjective:  ? Douglas Nelson is a 36 y.o. male who presents for Medicare Annual/Subsequent preventive examination. ? ?Review of Systems    ? ?Cardiac Risk Factors include: male gender ? ?   ?Objective:  ?  ?Today's Vitals  ? 06/14/21 1126 06/14/21 1127  ?Weight: 185 lb (83.9 kg)   ?Height: 6' (1.829 m)   ?PainSc:  6   ? ?Body mass index is 25.09 kg/m?. ? ? ?  06/14/2021  ? 11:32 AM 05/07/2021  ?  1:25 PM  ?Advanced Directives  ?Does Patient Have a Medical Advance Directive? Yes Yes  ?Type of Advance Directive Living will Healthcare Power of [place];Living will  ?Does patient want to make changes to medical advance directive?  No - Patient declined  ?Copy of Healthcare Power of Attorney in Chart?  Yes - validated most recent copy scanned in chart (See row information)  ? ? ?Current Medications (verified) ?Outpatient Encounter Medications as of 06/14/2021  ?Medication Sig  ? b complex vitamins capsule Take 1 capsule by mouth daily.  ? cetirizine (ZYRTEC) 10 MG tablet Take 1 tablet (10  mg total) by mouth daily. (Patient taking differently: Take 10 mg by mouth daily. Pt alternates between Claritin and Zyrtec every 6 months.)  ? Multiple Vitamin (MULTIVITAMIN) tablet Take 1 tablet by mouth daily.  ? oxyCODONE (OXY IR/ROXICODONE) 5 MG immediate release tablet Take 1 tablet (5 mg total) by mouth every 8 (eight) hours as needed.  ? Probiotic Product (PROBIOTIC ADVANCED PO) Take by mouth.  ? zinc gluconate 50 MG tablet Take 50 mg by mouth daily.  ? azithromycin (ZITHROMAX) 500 MG tablet Take 1 tab daily for 3 days if you have traveller's diarrhea or pneumonia. (Patient not taking: Reported on 05/07/2021)  ? benzonatate (TESSALON) 100 MG capsule Take 1 capsule (100 mg total) by mouth 2 (two) times daily as needed for cough. (Patient not taking: Reported on 05/07/2021)  ? ondansetron (ZOFRAN-ODT) 4 MG disintegrating tablet Take 1 tablet (4 mg total) by mouth every 8 (eight) hours as needed for nausea or vomiting. (Patient not taking: Reported on 05/07/2021)  ? typhoid (VIVOTIF) DR capsule Take 1 capsule by mouth every other day. (Patient not taking: Reported on 05/07/2021)  ? ?No facility-administered encounter medications on file as of 06/14/2021.  ? ? ?Allergies (verified) ?Patient has no known allergies.  ? ?History: ?Past Medical History:  ?Diagnosis Date  ? Chronic foot pain   ? ?Past Surgical History:  ?Procedure Laterality Date  ? SUBTALAR DISTRACTION BONE BLOCK FUSION WITH ILIAC CREST BONE GRAFT Left   ? ?Family History  ?Problem Relation Age of Onset  ?  Hypertension Maternal Grandmother   ? Hypertension Maternal Grandfather   ? Anuerysm Father   ? Hypertension Father   ? Cancer Mother   ?     Ovarian  ? ?Social History  ? ?Socioeconomic History  ? Marital status: Married  ?  Spouse name: Not on file  ? Number of children: Not on file  ? Years of education: Not on file  ? Highest education level: Not on file  ?Occupational History  ? Not on file  ?Tobacco Use  ? Smoking status: Former  ?  Types:  Cigarettes  ?  Quit date: 08/30/2010  ?  Years since quitting: 10.7  ? Smokeless tobacco: Never  ?Vaping Use  ? Vaping Use: Never used  ?Substance and Sexual Activity  ? Alcohol use: Not Currently  ?  Comment: occasionally  ? Drug use: No  ? Sexual activity: Not on file  ?Other Topics Concern  ? Not on file  ?Social History Narrative  ? Not on file  ? ?Social Determinants of Health  ? ?Financial Resource Strain: Low Risk   ? Difficulty of Paying Living Expenses: Not hard at all  ?Food Insecurity: No Food Insecurity  ? Worried About Programme researcher, broadcasting/film/videounning Out of Food in the Last Year: Never true  ? Ran Out of Food in the Last Year: Never true  ?Transportation Needs: No Transportation Needs  ? Lack of Transportation (Medical): No  ? Lack of Transportation (Non-Medical): No  ?Physical Activity: Insufficiently Active  ? Days of Exercise per Week: 4 days  ? Minutes of Exercise per Session: 10 min  ?Stress: No Stress Concern Present  ? Feeling of Stress : Not at all  ?Social Connections: Not on file  ? ? ?Tobacco Counseling ?Counseling given: Not Answered ? ? ?Clinical Intake: ? ?Pre-visit preparation completed: Yes ? ?Pain : 0-10 ?Pain Score: 6  ?Pain Type: Chronic pain ?Pain Location: Generalized ?Pain Descriptors / Indicators: Sharp, Burning ?Pain Onset: More than a month ago ?Pain Frequency: Constant ? ?  ? ?Nutritional Status: BMI 25 -29 Overweight ?Nutritional Risks: None ?Diabetes: No ? ?How often do you need to have someone help you when you read instructions, pamphlets, or other written materials from your doctor or pharmacy?: 1 - Never ?What is the last grade level you completed in school?: 12th grade ? ?Diabetic? no ? ?Interpreter Needed?: No ? ?Information entered by :: NAllen LPN ? ? ?Activities of Daily Living ? ?  06/14/2021  ? 11:34 AM  ?In your present state of health, do you have any difficulty performing the following activities:  ?Hearing? 0  ?Vision? 0  ?Difficulty concentrating or making decisions? 0  ?Walking or  climbing stairs? 1  ?Dressing or bathing? 0  ?Doing errands, shopping? 0  ?Preparing Food and eating ? N  ?Using the Toilet? N  ?In the past six months, have you accidently leaked urine? N  ?Do you have problems with loss of bowel control? N  ?Managing your Medications? N  ?Managing your Finances? N  ?Housekeeping or managing your Housekeeping? N  ? ? ?Patient Care Team: ?Copland, Gwenlyn FoundJessica C, MD as PCP - General (Family Medicine) ? ?Indicate any recent Medical Services you may have received from other than [facility] providers in the past year (date may be approximate). ? ?   ?Assessment:  ? This is a routine wellness examination for Douglas Nelson. ? ?Hearing/Vision screen ?Vision Screening - Comments:: No regular eye exams, [facility] ? ?Dietary issues and exercise activities discussed: ?Current Exercise  Habits: Home exercise routine, Type of exercise: Other - see comments (stationary bike), Time (Minutes): 10, Frequency (Times/Week): 4, Weekly Exercise (Minutes/Week): 40 ? ? Goals Addressed   ? ?  ?  ?  ?  ? This Visit's Progress  ?  Patient Stated     ?  06/14/2021, no goals ?  ? ?  ? ?Depression Screen ? ?  06/14/2021  ? 11:34 AM 12/11/2020  ?  9:49 AM 02/04/2018  ?  3:34 PM 11/25/2016  ?  9:44 AM 11/10/2014  ? 10:31 AM 11/08/2013  ? 10:42 AM  ?PHQ 2/9 Scores  ?PHQ - 2 Score 0 0 0 0 0 0  ?Exception Documentation    Patient refusal    ?  ?Fall Risk ? ?  06/14/2021  ? 11:33 AM 04/04/2021  ?  2:40 PM 11/25/2016  ?  9:44 AM 11/10/2014  ? 10:31 AM 11/08/2013  ? 10:42 AM  ?Fall Risk   ?Falls in the past year? 0 0 Yes No No  ?Number falls in past yr: 0 0 2 or more    ?Injury with Fall? 0 0 No    ?Risk for fall due to : Impaired mobility      ?Follow up Falls evaluation completed;Education provided;Falls prevention discussed  Falls prevention discussed    ? ? ?FALL RISK PREVENTION PERTAINING TO THE HOME: ? ?Any stairs in or around the home? Yes  ?If so, are there any without handrails? No  ?Home free of loose throw rugs in walkways, pet beds,  electrical cords, etc? Yes  ?Adequate lighting in your home to reduce risk of falls? Yes  ? ?ASSISTIVE DEVICES UTILIZED TO PREVENT FALLS: ? ?Life alert? No  ?Use of a cane, walker or w/c? Yes  ?Grab bars in the ba

## 2021-06-14 NOTE — Patient Instructions (Signed)
Douglas Nelson,  ?Thank you for taking time to come for your Medicare Wellness Visit. I appreciate your ongoing commitment to your health goals. Please review the following plan we discussed and let me know if I can assist you in the future.  ? ?Screening recommendations/referrals: ?Colonoscopy: not required ?Recommended yearly ophthalmology/optometry visit for glaucoma screening and checkup ?Recommended yearly dental visit for hygiene and checkup ? ?Vaccinations: ?Influenza vaccine: due next flu season ?Pneumococcal vaccine: n/a ?Tdap vaccine: completed 11/22/2015, due 11/21/2025 ?Shingles vaccine: n/a   ?Covid-19:  11/28/2020, 01/05/2020, 06/28/2019, 05/31/2019 ? ?Advanced directives: Please bring a copy of your POA (Power of Attorney) and/or Living Will to your next appointment.  ? ?Conditions/risks identified: none ? ?Next appointment: Follow up in one year for your annual wellness visit  ? ?Preventive Care 35-8 Years Old, Male ?Preventive care refers to lifestyle choices and visits with your health care provider that can promote health and wellness. Preventive care visits are also called wellness exams. ?What can I expect for my preventive care visit? ?Counseling ?During your preventive care visit, your health care provider may ask about your: ?Medical history, including: ?Past medical problems. ?Family medical history. ?Current health, including: ?Emotional well-being. ?Home life and relationship well-being. ?Sexual activity. ?Lifestyle, including: ?Alcohol, nicotine or tobacco, and drug use. ?Access to firearms. ?Diet, exercise, and sleep habits. ?Safety issues such as seatbelt and bike helmet use. ?Sunscreen use. ?Work and work Astronomer. ?Physical exam ?Your health care provider may check your: ?Height and weight. These may be used to calculate your BMI (body mass index). BMI is a measurement that tells if you are at a healthy weight. ?Waist circumference. This measures the distance around your waistline. This  measurement also tells if you are at a healthy weight and may help predict your risk of certain diseases, such as type 2 diabetes and high blood pressure. ?Heart rate and blood pressure. ?Body temperature. ?Skin for abnormal spots. ?What immunizations do I need? ?Vaccines are usually given at various ages, according to a schedule. Your health care provider will recommend vaccines for you based on your age, medical history, and lifestyle or other factors, such as travel or where you work. ?What tests do I need? ?Screening ?Your health care provider may recommend screening tests for certain conditions. This may include: ?Lipid and cholesterol levels. ?Diabetes screening. This is done by checking your blood sugar (glucose) after you have not eaten for a while (fasting). ?Hepatitis B test. ?Hepatitis C test. ?HIV (human immunodeficiency virus) test. ?STI (sexually transmitted infection) testing, if you are at risk. ?Talk with your health care provider about your test results, treatment options, and if necessary, the need for more tests. ?Follow these instructions at home: ?Eating and drinking ? ?Eat a healthy diet that includes fresh fruits and vegetables, whole grains, lean protein, and low-fat dairy products. ?Drink enough fluid to keep your urine pale yellow. ?Take vitamin and mineral supplements as recommended by your health care provider. ?Do not drink alcohol if your health care provider tells you not to drink. ?If you drink alcohol: ?Limit how much you have to 0-2 drinks a day. ?Know how much alcohol is in your drink. In the [place] one drink equals one 12 oz bottle of beer (355 mL), one 5 oz glass of wine (148 mL), or one 1? oz glass of hard liquor (44 mL). ?Lifestyle ?Brush your teeth every morning and night with fluoride toothpaste. Floss one time each day. ?Exercise for at least 30 minutes 5 or more days  each week. ?Do not use any products that contain nicotine or tobacco. These products include cigarettes,  chewing tobacco, and vaping devices, such as e-cigarettes. If you need help quitting, ask your health care provider. ?Do not use drugs. ?If you are sexually active, practice safe sex. Use a condom or other form of protection to prevent STIs. ?Find healthy ways to manage stress, such as: ?Meditation, yoga, or listening to music. ?Journaling. ?Talking to a trusted person. ?Spending time with friends and family. ?Minimize exposure to UV radiation to reduce your risk of skin cancer. ?Safety ?Always wear your seat belt while driving or riding in a vehicle. ?Do not drive: ?If you have been drinking alcohol. Do not ride with someone who has been drinking. ?If you have been using any mind-altering substances or drugs. ?While texting. ?When you are tired or distracted. ?Wear a helmet and other protective equipment during sports activities. ?If you have firearms in your house, make sure you follow all gun safety procedures. ?Seek help if you have been physically or sexually abused. ?What's next? ?Go to your health care provider once a year for an annual wellness visit. ?Ask your health care provider how often you should have your eyes and teeth checked. ?Stay up to date on all vaccines. ?This information is not intended to replace advice given to you by your health care provider. Make sure you discuss any questions you have with your health care provider. ?Document Revised: 08/23/2020 Document Reviewed: 08/23/2020 ?Elsevier Patient Education ? 2022 [employer]  ?

## 2021-06-19 ENCOUNTER — Ambulatory Visit: Payer: Medicare HMO | Attending: Family Medicine | Admitting: Physical Therapy

## 2021-06-19 ENCOUNTER — Encounter: Payer: Self-pay | Admitting: Physical Therapy

## 2021-06-19 DIAGNOSIS — M6281 Muscle weakness (generalized): Secondary | ICD-10-CM | POA: Diagnosis not present

## 2021-06-19 DIAGNOSIS — M533 Sacrococcygeal disorders, not elsewhere classified: Secondary | ICD-10-CM | POA: Diagnosis not present

## 2021-06-19 NOTE — Therapy (Signed)
[place] ?[facility] ?[address] ?[place], [place], [address] ?Phone: [phone]   Fax:  [phone] ? ?Physical Therapy Treatment ? ?Patient Details  ?Name: Douglas Nelson ?MRN: [id number] ?Date of Birth: 03-22-1985 ?Referring Provider (PT): Audelia Acton, DO ? ? ?Encounter Date: 06/19/2021 ? ? PT End of Session - 06/19/21 4010   ? ? Visit Number 9   ? Number of Visits 20   ? Date for PT Re-Evaluation 07/17/21   ? Authorization Type Aetna Medicare   ? Progress Note Due on Visit 18   ? PT Start Time [phone]   ? PT Stop Time 1014   pt 8 min late  ? PT Time Calculation (min) 36 min   ? Activity Tolerance Patient tolerated treatment well   ? Behavior During Therapy [facility] for tasks assessed/performed   ? ?  ?  ? ?  ? ? ?Past Medical History:  ?Diagnosis Date  ? Chronic foot pain   ? ? ?Past Surgical History:  ?Procedure Laterality Date  ? SUBTALAR DISTRACTION BONE BLOCK FUSION WITH ILIAC CREST BONE GRAFT Left   ? ? ?There were no vitals filed for this visit. ? ? Subjective Assessment - 06/19/21 0940   ? ? Subjective Pt reporting increased pain last night to 7/10; Sore this morning at 5/10. He reports he has not been compliant with HEP only doing 2x in the past 2 weeks due to being busy.   ? Pertinent History Chronic L foot and ankle pain; L talocalcaneal coalition; L subtalar distraction bone block fusion with iliac crest bone graft   ? Patient Stated Goals "To be able to sit and lay with less discomfort"   ? Currently in Pain? Yes   ? Pain Score 5    ? Pain Location Coccyx   ? Pain Descriptors / Indicators Aching   ? ?  ?  ? ?  ? ? ? ? ? ? ? ? ? ? ? ? ? ? ? ? ? ? ? ? [place] Adult PT Treatment/Exercise - 06/19/21 0001   ? ?  ? Knee/Hip Exercises: Stretches  ? Piriformis Stretch Both;1 rep;30 seconds   ?  ? Knee/Hip Exercises: Aerobic  ? Elliptical L3 and 4 x 5 min   ? Recumbent Bike --   ?  ? Knee/Hip Exercises: Standing  ? Functional Squat 2 sets;10 seconds   ?  Functional Squat Limitations to mat table; GTB around thighs; 10# 2nd set   ? Lunge Walking - Round Trips TRX split stance lunges 2 x 10 ea   ? Other Standing Knee Exercises dead lifts 10# ea hand x 30   ?  ? Knee/Hip Exercises: Supine  ? Bridges with Clamshell Both;10 reps;2 sets   GTB; 5 sec hold  ? Single Leg Bridge Both;5 reps   5 sec hold  ?  ? Knee/Hip Exercises: Sidelying  ? Clams GTB x 10 ea L/R in side plank elbow to knee   ? ?  ?  ? ?  ? ? ? ? ? ? ? ? ? ? ? ? PT Short Term Goals - 05/22/21 1114   ? ?  ? PT SHORT TERM GOAL #1  ? Title Patient will be independent with initial HEP   ? Status Achieved   05/22/21  ? ?  ?  ? ?  ? ? ? ? PT Long Term Goals - 06/05/21 0940   ? ?  ? PT LONG  TERM GOAL #1  ? Title Patient will demonstrate independent use of ongoing/advanced HEP to facilitate ability to maintain/progress functional gains from skilled physical therapy services   ? Status Partially Met   ? Target Date 07/17/21   ?  ? PT LONG TERM GOAL #2  ? Title Patient to report reduction in frequency and intensity of coccygeal pain by >/= 50% to allow for improved positional and activity tolerance   ? Status On-going   06/04/21 - Pt reports 10% improvement in pain  ? Target Date 07/17/21   ?  ? PT LONG TERM GOAL #3  ? Title Patient will demonstrate improved B proximal LE strength to >/= 4+/5 for improved stability and ease of mobility   ? Status Partially Met   ? Target Date 07/17/21   ?  ? PT LONG TERM GOAL #4  ? Title Patient will be able to sit for at least 30 minutes for meals or riding in the car with pain level 3/10 or less   ? Status Partially Met   06/05/21 - sitting tolerance up to 20-30 minutes  ? Target Date 07/17/21   ?  ? PT LONG TERM GOAL #5  ? Title Patient will report no sleep disturbance due to coccygeal pain when laying in bed   ? Status On-going   ? Target Date 07/17/21   ? ?  ?  ? ?  ? ? ? ? ? ? ? ? Plan - 06/19/21 1012   ? ? Clinical Impression Statement Patient with increased pain today. He reports  non-compliance with HEP for past 2 weeks due to home renovations. We focused on functional LE strengthening today with good results. No complaints of pain but definitely challenging.   ? Comorbidities Chronic L foot and ankle pain; L talocalcaneal coalition; L subtalar distraction bone block fusion with iliac crest bone graft   ? PT Frequency 2x / week   ? PT Duration 6 weeks   ? PT Treatment/Interventions ADLs/Self Care Home Management;Cryotherapy;Electrical Stimulation;Iontophoresis 98m/ml Dexamethasone;Moist Heat;Functional mobility training;Therapeutic activities;Therapeutic exercise;Neuromuscular re-education;Patient/family education;Manual techniques;Passive range of motion;Dry needling;Taping;Spinal Manipulations   ? PT Next Visit Plan proximal LE flexibility and strengthening; continued MT +/- DN for lumbosacral paraspinals and L/R hip for further treatment as indicated; mobs prn   ? Consulted and Agree with Plan of Care Patient   ? ?  ?  ? ?  ? ? ?Patient will benefit from skilled therapeutic intervention in order to improve the following deficits and impairments:  Abnormal gait, Decreased activity tolerance, Decreased mobility, Decreased strength, Difficulty walking, Increased fascial restricitons, Increased muscle spasms, Impaired flexibility, Improper body mechanics, Postural dysfunction, Pain ? ?Visit Diagnosis: ?Sacrococcygeal disorders, not elsewhere classified ? ?Muscle weakness (generalized) ? ? ? ? ?Problem List ?Patient Active Problem List  ? Diagnosis Date Noted  ? 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 Left ankle pain 04/15/2016  ? Hematuria 01/25/2015  ? Left foot pain 12/16/2013  ? Overweight (BMI 25.0-29.9) 11/08/2013  ? 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 Allergic rhinitis, seasonal 06/14/2011  ? S/P foot surgery, left 04/04/2011  ? ?JMadelyn Flavors PT ?06/19/2021, 12:01 PM ? ?[place] ?[facility] ?[address]  [address]?Phone: [phone]  Fax:  [phone]? ?Name: JMartiniqueM Reining ?MRN: [id number]?Date of Birth: 602-Dec-1987? ? ? ?

## 2021-06-23 NOTE — Patient Instructions (Addendum)
Good to see you today!  Please see me in about 6 months assuming all is well  ?I ordered an ultrasound of the area over your tailbone- can be done at your convenience  ?

## 2021-06-23 NOTE — Progress Notes (Signed)
[facility] ?[address], [address] ?[place], [place] [address] ?336 [phone] ?Fax [phone] ? ?Date:  06/27/2021  ? ?Name:  Douglas Nelson   DOB:  1986/02/15   MRN:  [id number] ? ?PCP:  Pearline Cables, MD  ? ? ?Chief Complaint: 6 month follow up (Concerns/ questions: low back pain and lump/cyst at the tail bone.) ? ? ?History of Present Illness: ? ?Douglas Nelson is a 36 y.o. very pleasant male patient who presents with the following: ? 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  He is now status post subtalar fusion with iliac crest bone graft.  He does use a cane, uses oxycodone for chronic pain.  It is about the same - stable but painful.  He feels like other joints might be getting a bit worse over the years due to compensating for the ankle.  He has tried to get his diet under better control and is getting exercise on a regular basis.   ? ?Married to Merna- they have 3 kids, the oldest in college- he is doing well, finishing up his freshman year he plans to stay at school for summer classes and also is playing football which takes a lot of his time ?Youngest will be a FM in HS, their middle child is completing 10th grade ?Covid booster-recommend ?UDS is due today  ?Full labs done in October  ? ?He also notes tenderness over his coccyx- it has bothered him for a couple of years, but has been more tender for the last 2-3 months.  More recently he noted a mobile bump in the same area, he is not sure if this is related or coincidental ?He saw Dr Carmelia Roller who did not think it was an abscess ?He is doing PT for his back which does help - he feels like the bump however is a bit larger than it was  ?No drainage, no redness or heat.   ? ?His chronic ankle pain is stable, he continues to use oxycodone per normal routine.  Uses a cane ? ?Patient Active Problem List  ?  Diagnosis Date Noted  ? 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 Left ankle pain 04/15/2016  ? Hematuria 01/25/2015  ? Left foot pain 12/16/2013  ? Overweight (BMI 25.0-29.9) 11/08/2013  ? 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 Allergic rhinitis, seasonal 06/14/2011  ? S/P foot surgery, left 04/04/2011  ? ? ?Past Medical History:  ?Diagnosis Date  ? Chronic foot pain   ? ? ?Past Surgical History:  ?Procedure Laterality Date  ? SUBTALAR DISTRACTION BONE BLOCK FUSION WITH ILIAC CREST BONE GRAFT Left   ? ? ?Social History  ? ?Tobacco Use  ? Smoking status: Former  ?  Types: Cigarettes  ?  Quit date: 08/30/2010  ?  Years since quitting: 10.8  ? Smokeless tobacco: Never  ?Vaping Use  ? Vaping Use: Never used  ?Substance Use Topics  ? Alcohol use: Not Currently  ?  Comment: occasionally  ? Drug use: No  ? ? ?Family History  ?Problem Relation Age of Onset  ? Hypertension Maternal Grandmother   ? Hypertension Maternal Grandfather   ? Anuerysm Father   ? Hypertension Father   ? Cancer Mother   ?     Ovarian  ? ? ?No Known Allergies ? ?Medication list has been reviewed and updated. ? ?Current  Outpatient Medications on File Prior to Visit  ?Medication Sig Dispense Refill  ? b complex vitamins capsule Take 1 capsule by mouth daily.    ? cetirizine (ZYRTEC) 10 MG tablet Take 1 tablet (10 mg total) by mouth daily. (Patient taking differently: Take 10 mg by mouth daily. Pt alternates between Claritin and Zyrtec every 6 months.) 90 tablet 1  ? Multiple Vitamin (MULTIVITAMIN) tablet Take 1 tablet by mouth daily.    ? oxyCODONE (OXY IR/ROXICODONE) 5 MG immediate release tablet Take 1 tablet (5 mg total) by mouth every 8 (eight) hours as needed. 30 tablet 0  ? Probiotic Product (PROBIOTIC ADVANCED PO) Take by mouth.    ? zinc gluconate 50 MG tablet Take 50 mg by mouth daily.    ? ?No current facility-administered medications on file prior to visit.  ? ? ?Review of Systems: ? ?As per HPI- otherwise negative. ? ? ?Physical Examination: ?Vitals:  ?  06/27/21 1115  ?BP: 120/60  ?Pulse: 85  ?Resp: 18  ?Temp: 98 ?F (36.7 ?C)  ?SpO2: 97%  ? ?Vitals:  ? 06/27/21 1115  ?Weight: 184 lb 6.4 oz (83.6 kg)  ?Height: 6' (1.829 m)  ? ?Body mass index is 25.01 kg/m?. ?Ideal Body Weight: Weight in (lb) to have BMI = 25: 183.9 ? ?GEN: no acute distress.  Normal weight, looks well ?HEENT: Atraumatic, Normocephalic.  ?Ears and Nose: No external deformity. ?CV: RRR, No M/G/R. No JVD. No thrill. No extra heart sounds. ?PULM: CTA B, no wheezes, crackles, rhonchi. No retractions. No resp. distress. No accessory muscle use. ?ABD: S, NT, ND ?EXTR: No c/c/e ?PSYCH: Normally interactive. Conversant.  ?At the superior left gluteal cleft displays a mobile subcutaneous cystic structure, approximately the size of a standard marble ?It is not particularly tender, no evidence of an abscess ? ? ?Assessment and Plan: ?Chronic pain syndrome - Plan: DRUG MONITORING, PANEL 8 WITH CONFIRMATION, URINE, oxyCODONE (OXY IR/ROXICODONE) 5 MG immediate release tablet ? ?Screening for diabetes mellitus - Plan: Basic metabolic panel ? ?Cyst of skin and subcutaneous tissue - Plan: [place] LT LOWER EXTREM LTD SOFT TISSUE NON VASCULAR ? ?Elevated bilirubin - Plan: Bilirubin, fractionated(tot/dir/indir) ? ?Patient seen today for follow-up.  He has chronic pain due to ankle joint disease, uses oxycodone.  Refill today and will check UDS ? 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  We will check a fractionated bilirubin today ? ?Advised patient that the cyst over his tailbone area is likely coincidental, I suspect not related to his pain.  However we will certainly get an ultrasound to confirm that this is indeed a cyst ? ?Assuming all is well plan to check back in 6 months ? ?Signed ?Abbe Amsterdam, MD ? ?

## 2021-06-26 ENCOUNTER — Ambulatory Visit: Payer: Medicare HMO

## 2021-06-27 ENCOUNTER — Ambulatory Visit (INDEPENDENT_AMBULATORY_CARE_PROVIDER_SITE_OTHER): Payer: Medicare HMO | Admitting: Family Medicine

## 2021-06-27 VITALS — BP 120/60 | HR 85 | Temp 98.0°F | Resp 18 | Ht 72.0 in | Wt 184.4 lb

## 2021-06-27 DIAGNOSIS — Z131 Encounter for screening for diabetes mellitus: Secondary | ICD-10-CM

## 2021-06-27 DIAGNOSIS — L720 Epidermal cyst: Secondary | ICD-10-CM

## 2021-06-27 DIAGNOSIS — R17 Unspecified jaundice: Secondary | ICD-10-CM | POA: Diagnosis not present

## 2021-06-27 DIAGNOSIS — G894 Chronic pain syndrome: Secondary | ICD-10-CM | POA: Diagnosis not present

## 2021-06-27 LAB — BASIC METABOLIC PANEL
BUN: 6 mg/dL (ref 6–23)
CO2: 33 mEq/L — ABNORMAL HIGH (ref 19–32)
Calcium: 10.1 mg/dL (ref 8.4–10.5)
Chloride: 101 mEq/L (ref 96–112)
Creatinine, Ser: 0.88 mg/dL (ref 0.40–1.50)
GFR: 111.91 mL/min (ref >60.00)
Glucose, Bld: 64 mg/dL — ABNORMAL LOW (ref 70–99)
Potassium: 4.6 mEq/L (ref 3.5–5.1)
Sodium: 140 mEq/L (ref 135–145)

## 2021-06-27 MED ORDER — OXYCODONE HCL 5 MG PO TABS: 5.0000 mg | ORAL_TABLET | Freq: Three times a day (TID) | ORAL | 0 refills | Status: DC | PRN

## 2021-06-28 ENCOUNTER — Encounter: Payer: Self-pay | Admitting: Family Medicine

## 2021-06-29 LAB — DRUG MONITORING, PANEL 8 WITH CONFIRMATION, URINE
6 Acetylmorphine: NEGATIVE ng/mL (ref <10)
Alcohol Metabolites: NEGATIVE ng/mL (ref <500)
Amphetamines: NEGATIVE ng/mL (ref <500)
Benzodiazepines: NEGATIVE ng/mL (ref <100)
Buprenorphine, Urine: NEGATIVE ng/mL (ref <5)
Cocaine Metabolite: NEGATIVE ng/mL (ref <150)
Creatinine: 110.5 mg/dL (ref >=20.0)
MDMA: NEGATIVE ng/mL (ref <500)
Marijuana Metabolite: NEGATIVE ng/mL (ref <20)
Noroxycodone: 394 ng/mL — ABNORMAL HIGH (ref <50)
Opiates: NEGATIVE ng/mL (ref <100)
Oxidant: NEGATIVE ug/mL (ref <200)
Oxycodone: NEGATIVE ng/mL (ref <50)
Oxycodone: POSITIVE ng/mL — AB (ref <100)
Oxymorphone: 196 ng/mL — ABNORMAL HIGH (ref <50)
pH: 7.6 (ref 4.5–9.0)

## 2021-06-29 LAB — BILIRUBIN, FRACTIONATED(TOT/DIR/INDIR)
Bilirubin, Direct: 0.2 mg/dL (ref 0.0–0.2)
Indirect Bilirubin: 1.2 mg/dL (calc) (ref 0.2–1.2)
Total Bilirubin: 1.4 mg/dL — ABNORMAL HIGH (ref 0.2–1.2)

## 2021-06-29 LAB — DM TEMPLATE

## 2021-07-03 ENCOUNTER — Ambulatory Visit: Payer: Medicare HMO

## 2021-07-03 ENCOUNTER — Ambulatory Visit (HOSPITAL_BASED_OUTPATIENT_CLINIC_OR_DEPARTMENT_OTHER): Payer: Medicare HMO

## 2021-07-10 ENCOUNTER — Ambulatory Visit: Payer: Medicare HMO

## 2021-07-12 ENCOUNTER — Encounter: Payer: Self-pay | Admitting: Physical Therapy

## 2021-07-12 ENCOUNTER — Ambulatory Visit (HOSPITAL_BASED_OUTPATIENT_CLINIC_OR_DEPARTMENT_OTHER)
Admission: RE | Admit: 2021-07-12 | Discharge: 2021-07-12 | Disposition: A | Discharge status: Home / Self Care | Payer: Medicare HMO | Source: Ambulatory Visit | Attending: Family Medicine | Admitting: Family Medicine

## 2021-07-12 ENCOUNTER — Ambulatory Visit: Payer: Medicare HMO | Attending: Family Medicine | Admitting: Physical Therapy

## 2021-07-12 DIAGNOSIS — M533 Sacrococcygeal disorders, not elsewhere classified: Secondary | ICD-10-CM | POA: Diagnosis not present

## 2021-07-12 DIAGNOSIS — R229 Localized swelling, mass and lump, unspecified: Secondary | ICD-10-CM | POA: Diagnosis not present

## 2021-07-12 DIAGNOSIS — M6281 Muscle weakness (generalized): Secondary | ICD-10-CM | POA: Insufficient documentation

## 2021-07-12 DIAGNOSIS — L720 Epidermal cyst: Secondary | ICD-10-CM | POA: Diagnosis not present

## 2021-07-12 NOTE — Therapy (Signed)
[place] ?[facility] ?[address] ?[place], [place], [address] ?Phone: [phone]   Fax:  [phone] ? ?Physical Therapy Treatment ? ?Patient Details  ?Name: Douglas Nelson ?MRN: [id number] ?Date of Birth: 1985/06/13 ?Referring Provider (PT): Audelia Acton, DO ? ? ?Encounter Date: 07/12/2021 ? ? PT End of Session - 07/12/21 1020   ? ? Visit Number 10   ? Number of Visits 20   ? Date for PT Re-Evaluation 07/17/21   ? Authorization Type Aetna Medicare   ? Progress Note Due on Visit 18   ? PT Start Time 6   Pt arrived late  ? PT Stop Time 6962   Pt requesting to leave early for imaging appt  ? PT Time Calculation (min) 36 min   ? Activity Tolerance Patient tolerated treatment well   ? Behavior During Therapy [facility] for tasks assessed/performed   ? ?  ?  ? ?  ? ? ?Past Medical History:  ?Diagnosis Date  ? Chronic foot pain   ? ? ?Past Surgical History:  ?Procedure Laterality Date  ? SUBTALAR DISTRACTION BONE BLOCK FUSION WITH ILIAC CREST BONE GRAFT Left   ? ? ?There were no vitals filed for this visit. ? ? Subjective Assessment - 07/12/21 1023   ? ? Subjective Pt reports MD wants him to have an ultrasound to see if he might have a cyst or something which is contributing to his pain - scheduled for 11:00 today so he will need to leave PT a few minutes early.   ? Pertinent History Chronic L foot and ankle pain; L talocalcaneal coalition; L subtalar distraction bone block fusion with iliac crest bone graft   ? Patient Stated Goals "To be able to sit and lay with less discomfort"   ? Currently in Pain? Yes   ? Pain Score 4    ? Pain Location Coccyx   ? Pain Orientation Lower   ? Pain Descriptors / Indicators --   "bruised"  ? Pain Type Acute pain;Chronic pain   ? ?  ?  ? ?  ? ? ? ? ? ? ? ? ? ? ? ? ? ? ? ? ? ? ? ? [place] Adult PT Treatment/Exercise - 07/12/21 1020   ? ?  ? Knee/Hip Exercises: Aerobic  ? Elliptical L4 x 5 min   ?  ? Knee/Hip Exercises: Standing  ?  Forward Lunges Right;Left;10 reps;3 seconds   ? Forward Lunges Limitations single leg lunge with rear foot on mat table (Bulgarian split squat)   ? Functional Squat 10 reps;3 seconds;2 sets   ? Functional Squat Limitations to mat table; BTB around thighs; 10# in each hand held out in front   ?  ? Knee/Hip Exercises: Supine  ? Bridges with Clamshell Both;10 reps;3 sets;Strengthening   BTB; 5 sec hold  ? Single Leg Bridge Right;Left;10 reps;Strengthening   5" hold  ?  ? Knee/Hip Exercises: Sidelying  ? Clams BTB 2 x 10 ea L/R in side plank elbow to knee   ?  ? Knee/Hip Exercises: Prone  ? Other Prone Exercises Quadruped fire hydrants x 10   ? ?  ?  ? ?  ? ? ? ? ? ? ? ? ? ? PT Education - 07/12/21 1055   ? ? Education Details HEP progression   ? Person(s) Educated Patient   ? Methods Explanation;Demonstration;Verbal cues;Handout   ? Comprehension Verbalized understanding;Verbal cues required;Returned demonstration   ? ?  ?  ? ?  ? ? ?  PT Short Term Goals - 05/22/21 1114   ? ?  ? PT SHORT TERM GOAL #1  ? Title Patient will be independent with initial HEP   ? Status Achieved   05/22/21  ? ?  ?  ? ?  ? ? ? ? PT Long Term Goals - 07/12/21 1027   ? ?  ? PT LONG TERM GOAL #1  ? Title Patient will demonstrate independent use of ongoing/advanced HEP to facilitate ability to maintain/progress functional gains from skilled physical therapy services   ? Status Partially Met   ? Target Date 07/17/21   ?  ? PT LONG TERM GOAL #2  ? Title Patient to report reduction in frequency and intensity of coccygeal pain by >/= 50% to allow for improved positional and activity tolerance   ? Status On-going   06/04/21 - Pt reports 10% improvement in pain  ? Target Date 07/17/21   ?  ? PT LONG TERM GOAL #3  ? Title Patient will demonstrate improved B proximal LE strength to >/= 4+/5 for improved stability and ease of mobility   ? Status Partially Met   ? Target Date 07/17/21   ?  ? PT LONG TERM GOAL #4  ? Title Patient will be able to sit for at  least 30 minutes for meals or riding in the car with pain level 3/10 or less   ? Status Partially Met   06/05/21 - sitting tolerance up to 20-30 minutes  ? Target Date 07/17/21   ?  ? PT LONG TERM GOAL #5  ? Title Patient will report no sleep disturbance due to coccygeal pain when laying in bed   ? Status On-going   ? Target Date 07/17/21   ? ?  ?  ? ?  ? ? ? ? ? ? ? ? Plan - 07/12/21 1056   ? ? Clinical Impression Statement Douglas reports PT has been helping and he feels that he is starting to build up some muscle mass in his buttocks but still has pain. MD is having him go for an [place] today to see if there is anything that may be contributing to his pain. He is nearing the end of his POC and would like to try transitioning to his HEP as his schedule has been very busy, making it difficult to get to his PT appointments (last visit was on 06/19/21). As such, we reviewed and progressed his HEP, adding some new exercises and advancing resistance for some existing exercises with good tolerance reported. Will plan for final goal assessment next visit and anticipate transition to the HEP at that time, potentially considering a reduced frequency or 30-day hold for f/u as indicated.   ? Comorbidities Chronic L foot and ankle pain; L talocalcaneal coalition; L subtalar distraction bone block fusion with iliac crest bone graft   ? Rehab Potential Good   ? PT Frequency 2x / week   ? PT Duration 6 weeks   ? PT Treatment/Interventions ADLs/Self Care Home Management;Cryotherapy;Electrical Stimulation;Iontophoresis 2m/ml Dexamethasone;Moist Heat;Functional mobility training;Therapeutic activities;Therapeutic exercise;Neuromuscular re-education;Patient/family education;Manual techniques;Passive range of motion;Dry needling;Taping;Spinal Manipulations   ? PT Next Visit Plan goal assessment; anticipate transition to HEP +/- reduced frequency or 30-day hold for f/u as indicated   ? [facility]  ? Consulted and Agree  with Plan of Care Patient   ? ?  ?  ? ?  ? ? ?Patient will benefit from skilled therapeutic intervention in order to improve  the following deficits and impairments:  Abnormal gait, Decreased activity tolerance, Decreased mobility, Decreased strength, Difficulty walking, Increased fascial restricitons, Increased muscle spasms, Impaired flexibility, Improper body mechanics, Postural dysfunction, Pain ? ?Visit Diagnosis: ?Sacrococcygeal disorders, not elsewhere classified ? ?Muscle weakness (generalized) ? ? ? ? ?Problem List ?Patient Active Problem List  ? Diagnosis Date Noted  ? 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 Left ankle pain 04/15/2016  ? Hematuria 01/25/2015  ? Left foot pain 12/16/2013  ? Overweight (BMI 25.0-29.9) 11/08/2013  ? 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 Allergic rhinitis, seasonal 06/14/2011  ? S/P foot surgery, left 04/04/2011  ? ? ?Percival Spanish, PT ?07/12/2021, 11:28 AM ? ?[place] ?[facility] ?[address] ?[place], [place], [address] ?Phone: [phone]   Fax:  [phone] ? ?Name: Douglas Nelson ?MRN: [id number] ?Date of Birth: 1985-06-01 ? ? ? ?

## 2021-07-12 NOTE — Patient Instructions (Signed)
? ? ?  Access Code: [email or web address] ?URL: [email or web address] ?Date: 07/12/2021 ?Prepared by: Glenetta Hew ? ?Exercises ?- Seated Table Hamstring Stretch  - 2 x daily - 7 x weekly - 2 sets - 2 reps - 30 second hold ?- Seated Table Piriformis Stretch  - 2 x daily - 7 x weekly - 2 sets - 2 reps - 30 sec hold ?- Active Hip Flexor Stretch  - 2 x daily - 7 x weekly - 1 sets - 3 reps - 30-60 sec hold ?- Hip Abduction with Resistance Loop  - 1 x daily - 3 x weekly - 3 sets - 10 reps ?- Hip Extension with Resistance Loop  - 1 x daily - 3 x weekly - 3 sets - 10 reps ?- Standing Hip Adduction with Resistance  - 1 x daily - 3 x weekly - 3 sets - 10 reps ?- Marching with Resistance  - 1 x daily - 3 x weekly - 2 sets - 10 reps - 3 sec hold ?- Bird Dog  - 1 x daily - 3 x weekly - 3 sets - 10 reps - 5 sec hold ?- Standing Single Leg Hip ER   - 1 x daily - 3 x weekly - 3 sets - 10 reps ?- Seated Hip Internal Rotation with Ball and Resistance  - 1 x daily - 3 x weekly - 3 sets - 10 reps ?- Bridge with Hip Abduction and Resistance  - 1 x daily - 3 x weekly - 2 sets - 10 reps - 5 sec hold ?- Side Plank with Clam and Resistance  - 1 x daily - 3 x weekly - 2 sets - 10 reps - 3 sec hold ?- Single Leg Bridge  - 1 x daily - 3 x weekly - 2 sets - 10 reps - 3 sec hold ?- Quadruped Hip Abduction with Resistance Loop  - 1 x daily - 3 x weekly - 2 sets - 10 reps - 3 sec hold ?- Squat with Chair Touch and Resistance Loop  - 1 x daily - 3 x weekly - 2 sets - 10 reps - 3 sec hold ?- Single Leg Lunge with Foot on Bench  - 1 x daily - 3 x weekly - 2 sets - 10 reps - 3 sec hold ?

## 2021-07-13 ENCOUNTER — Encounter: Payer: Self-pay | Admitting: Family Medicine

## 2021-07-13 DIAGNOSIS — L720 Epidermal cyst: Secondary | ICD-10-CM

## 2021-07-16 NOTE — Addendum Note (Signed)
Addended by: Lamar Blinks C on: 07/16/2021 08:41 AM ? ? Modules accepted: Orders ? ?

## 2021-07-17 ENCOUNTER — Ambulatory Visit: Payer: Medicare HMO

## 2021-07-17 DIAGNOSIS — M533 Sacrococcygeal disorders, not elsewhere classified: Secondary | ICD-10-CM | POA: Diagnosis not present

## 2021-07-17 DIAGNOSIS — M6281 Muscle weakness (generalized): Secondary | ICD-10-CM | POA: Diagnosis not present

## 2021-07-17 NOTE — Therapy (Addendum)
[facility] [address]  [place], [place], [address] Phone: [phone]   Fax:  [phone]  Physical Therapy Treatment / Discharge Summary  Patient Details  Name: Douglas Nelson MRN: [id number] Date of Birth: 1986-02-19 Referring Provider (PT): Audelia Acton, DO   Encounter Date: 07/17/2021   PT End of Session - 07/17/21 1101     Visit Number 11    Number of Visits 20    Date for PT Re-Evaluation 07/17/21    Authorization Type [employer]    Progress Note Due on Visit 18    PT Start Time 1022   pt arrived late   PT Stop Time 1058    PT Time Calculation (min) 36 min    Activity Tolerance Patient tolerated treatment well    Behavior During Therapy WFL for tasks assessed/performed             Past Medical History:  Diagnosis Date   Chronic foot pain     Past Surgical History:  Procedure Laterality Date   SUBTALAR DISTRACTION BONE BLOCK FUSION WITH ILIAC CREST BONE GRAFT Left     There were no vitals filed for this visit.   Subjective Assessment - 07/17/21 1025     Subjective Still having pain but ready to transition to HEP.    Pertinent History Chronic L foot and ankle pain; L talocalcaneal coalition; L subtalar distraction bone block fusion with iliac crest bone graft    Patient Stated Goals "To be able to sit and lay with less discomfort"    Currently in Pain? Yes    Pain Score 5     Pain Location Coccyx    Pain Orientation Lower    Pain Descriptors / Indicators --   bruised   Pain Type Acute pain;Chronic pain                [facility] PT Assessment - 07/17/21 0001       Strength   Right Hip Flexion 4+/5    Right Hip Extension 4+/5    Right Hip ABduction 4+/5    Right Hip ADduction 4+/5    Left Hip Flexion 4+/5    Left Hip Extension 4+/5    Left Hip ABduction 4+/5    Left Hip ADduction 4+/5    Right Knee Flexion 5/5    Right Knee Extension 5/5    Left Knee Flexion 5/5    Left  Knee Extension 4+/5                           [facility] Adult PT Treatment/Exercise - 07/17/21 0001       Self-Care   Self-Care Other Self-Care Comments    Other Self-Care Comments  review of HEP and gave instructions on progression of exercises and avoiding over straining muscles      Knee/Hip Exercises: Aerobic   Recumbent Bike L4 x 6 min                     PT Education - 07/17/21 1058     Education Details review of HEP, gave Blue TB to progress exercises at home. Gave instruction on alternating days with exercises and avoiding pushing through any pain with exercises.    Person(s) Educated Patient    Methods Explanation    Comprehension Verbalized understanding;Need further instruction  PT Short Term Goals - 05/22/21 1114       PT SHORT TERM GOAL #1   Title Patient will be independent with initial HEP    Status Achieved   05/22/21              PT Long Term Goals - 07/17/21 1030       PT LONG TERM GOAL #1   Title Patient will demonstrate independent use of ongoing/advanced HEP to facilitate ability to maintain/progress functional gains from skilled physical therapy services    Status Achieved   07/17/21   Target Date 07/17/21      PT LONG TERM GOAL #2   Title Patient to report reduction in frequency and intensity of coccygeal pain by >/= 50% to allow for improved positional and activity tolerance    Status Not Met   07/17/21 - Pt reports 20% improvement in pain   Target Date 07/17/21      PT LONG TERM GOAL #3   Title Patient will demonstrate improved B proximal LE strength to >/= 4+/5 for improved stability and ease of mobility    Status Achieved   07/17/21   Target Date 07/17/21      PT LONG TERM GOAL #4   Title Patient will be able to sit for at least 30 minutes for meals or riding in the car with pain level 3/10 or less    Status Partially Met   07/17/21- pain 6-7/10 if sitting for 20-30 min   Target Date 07/17/21       PT LONG TERM GOAL #5   Title Patient will report no sleep disturbance due to coccygeal pain when laying in bed    Status Achieved   07/17/21   Target Date 07/17/21                   Plan - 07/17/21 1105     Clinical Impression Statement Pt is seeing little improvement in tailbone pain overall. He is not limited with sleeping due to pain so he has met LTG #5. He has also met LTG #3, showing good strength in B LE. Pt is still limited with 6-7/10 pain with sitting at dinner table or riding in car for at least 20-30 min LTG #2 is not met. Even though he has not fully met all goals, pt does wish to transition to HEP. We reviewed his current HEP, gave instruction on how to progress exercises to alternate days to avoid tendinitis/overuse. Pt will be D/C as of today.    Personal Factors and Comorbidities Time since onset of injury/illness/exacerbation;Past/Current Experience;Fitness;Comorbidity 1    Comorbidities Chronic L foot and ankle pain; L talocalcaneal coalition; L subtalar distraction bone block fusion with iliac crest bone graft    PT Frequency 2x / week    PT Duration 6 weeks    PT Treatment/Interventions ADLs/Self Care Home Management;Cryotherapy;Electrical Stimulation;Iontophoresis 61m/ml Dexamethasone;Moist Heat;Functional mobility training;Therapeutic activities;Therapeutic exercise;Neuromuscular re-education;Patient/family education;Manual techniques;Passive range of motion;Dry needling;Taping;Spinal Manipulations    PT Next Visit Plan Discharge from PT    [facility]   Consulted and Agree with Plan of Care Patient             Patient will benefit from skilled therapeutic intervention in order to improve the following deficits and impairments:  Abnormal gait, Decreased activity tolerance, Decreased mobility, Decreased strength, Difficulty walking, Increased fascial restricitons, Increased muscle spasms, Impaired flexibility, Improper body mechanics, Postural  dysfunction, Pain  Visit Diagnosis: Sacrococcygeal  disorders, not elsewhere classified  Muscle weakness (generalized)     Problem List Patient Active Problem List   Diagnosis Date Noted   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  [place], [place], [address] Phone: [phone]   Fax:  [phone]  Name: Douglas M Garbett MRN: [id number] Date of Birth: December 16, 1985   PHYSICAL THERAPY DISCHARGE SUMMARY  Visits from Start of Care: 11  Current functional level related to goals / functional outcomes:   Refer to above clinical impression and goal assessment.   Remaining deficits:   As above.    Education / Equipment:   HEP   Patient agrees to discharge. Patient goals were partially met. Patient is being discharged due to the patient's request.  Percival Spanish, PT, MPT 08/23/21, 4:48 PM  [facility] [address]  [place], [place], [address] Phone: [phone]   Fax:  [phone]

## 2021-07-20 ENCOUNTER — Telehealth: Payer: Self-pay

## 2021-07-20 NOTE — Telephone Encounter (Signed)
Authorization number: [id number] ? ?

## 2021-08-15 ENCOUNTER — Other Ambulatory Visit: Payer: Self-pay | Admitting: Family Medicine

## 2021-08-15 DIAGNOSIS — G894 Chronic pain syndrome: Secondary | ICD-10-CM

## 2021-08-15 MED ORDER — OXYCODONE HCL 5 MG PO TABS: 5.0000 mg | ORAL_TABLET | Freq: Three times a day (TID) | ORAL | 0 refills | Status: DC | PRN

## 2021-09-03 ENCOUNTER — Encounter: Payer: Self-pay | Admitting: Family Medicine

## 2021-09-03 ENCOUNTER — Other Ambulatory Visit: Payer: Self-pay | Admitting: Family Medicine

## 2021-09-03 DIAGNOSIS — G894 Chronic pain syndrome: Secondary | ICD-10-CM

## 2021-09-03 MED ORDER — OXYCODONE HCL 5 MG PO TABS: 5.0000 mg | ORAL_TABLET | Freq: Three times a day (TID) | ORAL | 0 refills | Status: DC | PRN

## 2021-09-25 ENCOUNTER — Other Ambulatory Visit: Payer: Self-pay | Admitting: Family Medicine

## 2021-09-25 DIAGNOSIS — G894 Chronic pain syndrome: Secondary | ICD-10-CM

## 2021-09-25 MED ORDER — OXYCODONE HCL 5 MG PO TABS: 5.0000 mg | ORAL_TABLET | Freq: Three times a day (TID) | ORAL | 0 refills | Status: DC | PRN

## 2021-10-16 ENCOUNTER — Other Ambulatory Visit: Payer: Self-pay | Admitting: Family Medicine

## 2021-10-16 ENCOUNTER — Encounter: Payer: Self-pay | Admitting: Family Medicine

## 2021-10-16 DIAGNOSIS — Q6689 Other  specified congenital deformities of feet: Secondary | ICD-10-CM

## 2021-10-16 DIAGNOSIS — G894 Chronic pain syndrome: Secondary | ICD-10-CM

## 2021-10-16 MED ORDER — OXYCODONE HCL 5 MG PO TABS
5.0000 mg | ORAL_TABLET | Freq: Three times a day (TID) | ORAL | 0 refills | Status: DC | PRN
Start: 2021-10-16 — End: 2021-11-15

## 2021-10-26 ENCOUNTER — Other Ambulatory Visit: Payer: Self-pay | Admitting: Orthopaedic Surgery

## 2021-10-26 DIAGNOSIS — M79671 Pain in right foot: Secondary | ICD-10-CM

## 2021-10-26 DIAGNOSIS — M25571 Pain in right ankle and joints of right foot: Secondary | ICD-10-CM | POA: Diagnosis not present

## 2021-10-26 DIAGNOSIS — M25572 Pain in left ankle and joints of left foot: Secondary | ICD-10-CM | POA: Diagnosis not present

## 2021-11-14 ENCOUNTER — Ambulatory Visit
Admission: RE | Admit: 2021-11-14 | Discharge: 2021-11-14 | Disposition: A | Discharge status: Home / Self Care | Payer: Medicare HMO | Source: Ambulatory Visit | Attending: Orthopaedic Surgery | Admitting: Orthopaedic Surgery

## 2021-11-14 DIAGNOSIS — M79671 Pain in right foot: Secondary | ICD-10-CM

## 2021-11-15 ENCOUNTER — Other Ambulatory Visit: Payer: Self-pay | Admitting: Family Medicine

## 2021-11-15 DIAGNOSIS — G894 Chronic pain syndrome: Secondary | ICD-10-CM

## 2021-11-15 MED ORDER — OXYCODONE HCL 5 MG PO TABS
5.0000 mg | ORAL_TABLET | Freq: Three times a day (TID) | ORAL | 0 refills | Status: DC | PRN
Start: 2021-11-15 — End: 2021-12-04

## 2021-11-15 NOTE — Telephone Encounter (Signed)
Patient is requesting a refill of the following medications: Requested Prescriptions   Pending Prescriptions Disp Refills   oxyCODONE (OXY IR/ROXICODONE) 5 MG immediate release tablet 30 tablet 0    Sig: Take 1 tablet (5 mg total) by mouth every 8 (eight) hours as needed.    Contract: none current UDS: none current Last Visit: 06/27/21 Next Visit: 12/12/21 Last Refill: 10/16/21  Please Advise

## 2021-11-21 DIAGNOSIS — M25571 Pain in right ankle and joints of right foot: Secondary | ICD-10-CM | POA: Diagnosis not present

## 2021-12-04 ENCOUNTER — Other Ambulatory Visit: Payer: Self-pay | Admitting: Family Medicine

## 2021-12-04 DIAGNOSIS — G894 Chronic pain syndrome: Secondary | ICD-10-CM

## 2021-12-05 MED ORDER — OXYCODONE HCL 5 MG PO TABS: 5.0000 mg | ORAL_TABLET | Freq: Three times a day (TID) | ORAL | 0 refills | Status: DC | PRN

## 2021-12-07 NOTE — Progress Notes (Unsigned)
[facility] [address] [place], [place] [address] [phone]  Date:  12/12/2021   Name:  Douglas Nelson   DOB:  1986/02/12   MRN:  [id number]  PCP:  Pearline Cables, MD    Chief Complaint: No chief complaint on file.   History of Present Illness:  Douglas M Sambrano is a 36 y.o. 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  He is now status post subtalar fusion with iliac crest bone graft.  He does use a cane, uses oxycodone for chronic pain.  It is about the same - stable but painful.  He feels like other joints might be getting a bit worse over the years due to compensating for the ankle.  He has tried to get his diet under better control and is getting exercise on a regular basis  He is married to [place], they have 3 children.  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   Allergic rhinitis, seasonal 06/14/2011   S/P foot surgery, left 04/04/2011    Past Medical History:  Diagnosis Date   Chronic foot pain     Past Surgical History:  Procedure Laterality Date   SUBTALAR DISTRACTION BONE BLOCK FUSION WITH ILIAC CREST BONE GRAFT Left     Social History    Tobacco Use   Smoking status: Former    Types: Cigarettes    Quit date: 08/30/2010    Years since quitting: 11.2   Smokeless tobacco: Never  Vaping Use   Vaping Use: Never used  Substance Use Topics   Alcohol use: Not Currently    Comment: occasionally   Drug use: No    Family History  Problem Relation Age of Onset   Hypertension Maternal Grandmother    Hypertension Maternal Grandfather    Anuerysm Father    Hypertension Father    Cancer Mother        Ovarian    No Known Allergies  Medication list has been reviewed and updated.  Current Outpatient Medications on File Prior to Visit  Medication Sig Dispense Refill   b complex vitamins capsule Take 1 capsule by mouth daily.     cetirizine (ZYRTEC) 10 MG tablet Take 1 tablet (10 mg total) by mouth daily. (Patient taking differently: Take 10 mg by mouth daily. Pt alternates between Claritin and Zyrtec every 6 months.) 90 tablet 1   Multiple Vitamin (MULTIVITAMIN) tablet Take 1 tablet by mouth daily.     oxyCODONE (OXY IR/ROXICODONE) 5 MG immediate release tablet Take 1 tablet (5 mg total) by mouth every 8 (eight) hours as needed. 30 tablet 0   Probiotic Product (PROBIOTIC ADVANCED PO) Take by mouth.  zinc gluconate 50 MG tablet Take 50 mg by mouth daily.     No current facility-administered medications on file prior to visit.    Review of Systems:  As per HPI- otherwise negative.   Physical Examination: There were no vitals filed for this visit. There were no vitals filed for this visit. There is no height or weight on file to calculate BMI. Ideal Body Weight:    GEN: no acute distress. HEENT: Atraumatic, Normocephalic.  Ears and Nose: No external deformity. CV: RRR, No M/G/R. No JVD. No thrill. No extra heart sounds. PULM: CTA B, no wheezes, crackles, rhonchi. No retractions. No resp. distress. No accessory muscle use. ABD: S, NT, ND, +BS. No rebound. No HSM. EXTR: No c/c/e PSYCH: Normally interactive.  Conversant.    Assessment and Plan: ***  Signed Lamar Blinks, MD

## 2021-12-07 NOTE — Patient Instructions (Incomplete)
It was great to see you again today, I hope you have a great fall  Please see me back in about 6 months assuming all is well- Douglas Nelson. We will do your annual UDS then   Recommend the new COVID shot this fall- DONE!  Flu shot today   Start PSA screening at age 36

## 2021-12-12 ENCOUNTER — Ambulatory Visit (INDEPENDENT_AMBULATORY_CARE_PROVIDER_SITE_OTHER): Payer: Medicare HMO | Admitting: Family Medicine

## 2021-12-12 ENCOUNTER — Encounter: Payer: Self-pay | Admitting: Family Medicine

## 2021-12-12 VITALS — BP 120/70 | HR 60 | Temp 97.6°F | Resp 18 | Ht 72.0 in | Wt 193.0 lb

## 2021-12-12 DIAGNOSIS — Z23 Encounter for immunization: Secondary | ICD-10-CM

## 2021-12-12 DIAGNOSIS — Z13220 Encounter for screening for lipoid disorders: Secondary | ICD-10-CM

## 2021-12-12 DIAGNOSIS — Z0000 Encounter for general adult medical examination without abnormal findings: Secondary | ICD-10-CM | POA: Diagnosis not present

## 2021-12-12 DIAGNOSIS — Z131 Encounter for screening for diabetes mellitus: Secondary | ICD-10-CM

## 2021-12-12 DIAGNOSIS — G894 Chronic pain syndrome: Secondary | ICD-10-CM

## 2021-12-12 DIAGNOSIS — Z130 Encounter for screening for diseases of the blood and blood-forming organs and certain disorders involving the immune mechanism: Secondary | ICD-10-CM

## 2021-12-12 LAB — LIPID PANEL
Cholesterol: 204 mg/dL — ABNORMAL HIGH (ref 0–200)
HDL: 66.7 mg/dL (ref >39.00)
LDL Cholesterol: 124 mg/dL — ABNORMAL HIGH (ref 0–99)
NonHDL: 137.31
Total CHOL/HDL Ratio: 3
Triglycerides: 67 mg/dL (ref 0.0–149.0)
VLDL: 13.4 mg/dL (ref 0.0–40.0)

## 2021-12-12 LAB — COMPREHENSIVE METABOLIC PANEL
ALT: 20 U/L (ref 0–53)
AST: 28 U/L (ref 0–37)
Albumin: 4.6 g/dL (ref 3.5–5.2)
Alkaline Phosphatase: 59 U/L (ref 39–117)
BUN: 12 mg/dL (ref 6–23)
CO2: 34 mEq/L — ABNORMAL HIGH (ref 19–32)
Calcium: 10 mg/dL (ref 8.4–10.5)
Chloride: 100 mEq/L (ref 96–112)
Creatinine, Ser: 0.86 mg/dL (ref 0.40–1.50)
GFR: 112.33 mL/min (ref >60.00)
Glucose, Bld: 82 mg/dL (ref 70–99)
Potassium: 4.5 mEq/L (ref 3.5–5.1)
Sodium: 139 mEq/L (ref 135–145)
Total Bilirubin: 1.4 mg/dL — ABNORMAL HIGH (ref 0.2–1.2)
Total Protein: 6.9 g/dL (ref 6.0–8.3)

## 2021-12-12 LAB — CBC
HCT: 43.1 % (ref 39.0–52.0)
Hemoglobin: 14.6 g/dL (ref 13.0–17.0)
MCHC: 33.9 g/dL (ref 30.0–36.0)
MCV: 88.9 fl (ref 78.0–100.0)
Platelets: 146 10*3/uL — ABNORMAL LOW (ref 150.0–400.0)
RBC: 4.84 Mil/uL (ref 4.22–5.81)
RDW: 12.8 % (ref 11.5–15.5)
WBC: 4 10*3/uL (ref 4.0–10.5)

## 2021-12-12 LAB — HEMOGLOBIN A1C: Hgb A1c MFr Bld: 5.4 % (ref 4.6–6.5)

## 2022-01-05 ENCOUNTER — Other Ambulatory Visit: Payer: Self-pay | Admitting: Family Medicine

## 2022-01-05 DIAGNOSIS — G894 Chronic pain syndrome: Secondary | ICD-10-CM

## 2022-01-07 ENCOUNTER — Encounter: Payer: Self-pay | Admitting: Family Medicine

## 2022-01-07 DIAGNOSIS — G894 Chronic pain syndrome: Secondary | ICD-10-CM

## 2022-01-07 MED ORDER — OXYCODONE HCL 5 MG PO TABS: 5.0000 mg | ORAL_TABLET | Freq: Three times a day (TID) | ORAL | 0 refills | Status: DC | PRN

## 2022-01-29 ENCOUNTER — Other Ambulatory Visit: Payer: Self-pay | Admitting: Family Medicine

## 2022-01-29 DIAGNOSIS — G894 Chronic pain syndrome: Secondary | ICD-10-CM

## 2022-01-30 MED ORDER — OXYCODONE HCL 5 MG PO TABS: 5.0000 mg | ORAL_TABLET | Freq: Three times a day (TID) | ORAL | 0 refills | Status: DC | PRN

## 2022-02-20 ENCOUNTER — Other Ambulatory Visit: Payer: Self-pay | Admitting: Family Medicine

## 2022-02-20 DIAGNOSIS — G894 Chronic pain syndrome: Secondary | ICD-10-CM

## 2022-02-20 MED ORDER — OXYCODONE HCL 5 MG PO TABS: 5.0000 mg | ORAL_TABLET | Freq: Three times a day (TID) | ORAL | 0 refills | Status: DC | PRN

## 2022-03-13 ENCOUNTER — Ambulatory Visit (INDEPENDENT_AMBULATORY_CARE_PROVIDER_SITE_OTHER): Payer: Medicare HMO

## 2022-03-13 ENCOUNTER — Ambulatory Visit
Admission: RE | Admit: 2022-03-13 | Discharge: 2022-03-13 | Disposition: A | Discharge status: Home / Self Care | Payer: Medicare HMO | Source: Ambulatory Visit | Attending: Family Medicine | Admitting: Family Medicine

## 2022-03-13 VITALS — BP 116/62 | HR 56 | Temp 98.6°F | Resp 18 | Ht 72.0 in | Wt 190.0 lb

## 2022-03-13 DIAGNOSIS — S6992XA Unspecified injury of left wrist, hand and finger(s), initial encounter: Secondary | ICD-10-CM | POA: Diagnosis not present

## 2022-03-13 NOTE — ED Triage Notes (Signed)
Patient injured left thumb on Monday, able to move his finger.  He does have a lot of bruising and swelling in his hand.  Patient has taken Ibuprofen for pain.

## 2022-03-13 NOTE — ED Provider Notes (Signed)
[name]    CSN: [id number] Arrival date & time: 03/13/22  1117      History   Chief Complaint Chief Complaint  Patient presents with   Finger Injury    I injured my left thumb/hand Monday and it's really swollen and bruised. I'd like an x-ray or other scan to see what's wrong. - Entered by patient    HPI Douglas Nelson is a 37 y.o. male.   HPI 37 year old male presents with left thumb pain secondary to injury on Monday.  Patient is concerned for fracture and would like x-ray.  PMH significant for Gilbert's syndrome and chronic foot pain.  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 Authorizing Provider  b complex vitamins capsule Take 1 capsule by mouth daily.   Yes [provider]  cetirizine (ZYRTEC) 10 MG tablet Take 1 tablet (10 mg total) by mouth daily. Patient taking differently: Take 10 mg by mouth daily. Pt alternates between Claritin and Zyrtec every 6 months. 03/02/15  Yes Midge Minium, MD  Multiple Vitamin (MULTIVITAMIN) tablet Take 1 tablet by mouth daily.   Yes [provider]  oxyCODONE (OXY IR/ROXICODONE) 5 MG immediate release tablet Take 1 tablet (5 mg total) by mouth every 8 (eight) hours as needed. 02/20/22  Yes Copland, Gay Filler, MD  Probiotic Product (PROBIOTIC ADVANCED PO) Take by mouth.   Yes [provider]  zinc gluconate 50 MG  tablet Take 50 mg by mouth daily.   Yes [provider]    Family History Family History  Problem Relation Age of Onset   Cancer Mother        Ovarian   Anuerysm Father    Hypertension Father    Hypertension Maternal Grandmother    Hypertension Maternal Grandfather     Social History Social History   Tobacco Use   Smoking status: Former    Types: Cigarettes    Quit date: 08/30/2010    Years since quitting: 11.5   Smokeless tobacco: Never  Vaping Use   Vaping Use: Never used  Substance Use Topics   Alcohol use: Not Currently    Comment: occasionally   Drug use: No     Allergies   Patient has no known allergies.   Review of Systems Review of Systems  Musculoskeletal:        Left thumb pain x 1 day  All other systems reviewed and are negative.    Physical Exam Triage Vital Signs ED Triage Vitals  Enc Vitals Group     BP 03/13/22 1132 116/62     Pulse Rate 03/13/22 1132 (!) 56     Resp 03/13/22 1132 18     Temp 03/13/22 1132 98.6 F (37 C)     Temp Source 03/13/22 1132 Oral     SpO2 03/13/22 1132 100 %  Weight 03/13/22 1134 190 lb (86.2 kg)     Height 03/13/22 1134 6' (1.829 m)     Head Circumference --      Peak Flow --      Pain Score 03/13/22 1134 5     Pain Loc --      Pain Edu? --      Excl. in [place]? --    No data found.  Updated Vital Signs BP 116/62 (BP Location: Right Arm)   Pulse (!) 56   Temp 98.6 F (37 C) (Oral)   Resp 18   Ht 6' (1.829 m)   Wt 190 lb (86.2 kg)   SpO2 100%   BMI 25.77 kg/m   Visual Acuity Right Eye Distance:   Left Eye Distance:   Bilateral Distance:    Right Eye Near:   Left Eye Near:    Bilateral Near:     Physical Exam Vitals and nursing note reviewed.  Constitutional:      Appearance: Normal appearance. He is normal weight.  HENT:     Head: Normocephalic and atraumatic.     Mouth/Throat:     Mouth: Mucous membranes are moist.     Pharynx: Oropharynx is clear.  Eyes:     Extraocular  Movements: Extraocular movements intact.     Conjunctiva/sclera: Conjunctivae normal.     Pupils: Pupils are equal, round, and reactive to light.  Cardiovascular:     Rate and Rhythm: Normal rate and regular rhythm.     Pulses: Normal pulses.     Heart sounds: Normal heart sounds.  Pulmonary:     Effort: Pulmonary effort is normal.     Breath sounds: Normal breath sounds. No wheezing, rhonchi or rales.  Musculoskeletal:        General: Normal range of motion.     Cervical back: Normal range of motion and neck supple.     Comments: Left thumb: LROM, moderate soft tissue swelling and ecchymosis noted at base of thenar eminence  Skin:    General: Skin is warm and dry.  Neurological:     General: No focal deficit present.     Mental Status: He is alert and oriented to person, place, and time.      UC Treatments / Results  Labs (all labs ordered are listed, but only abnormal results are displayed) Labs Reviewed - No data to display  EKG   Radiology DG Hand Complete Left  Result Date: 03/13/2022 CLINICAL DATA:  Left thumb injury 2 days ago. EXAM: LEFT HAND - COMPLETE 3+ VIEW COMPARISON:  None Available. FINDINGS: There is no acute fracture or dislocation. There is positive ulnar variance. Alignment is otherwise normal. Irregularity of the distal end of the ulnar styloid may reflect sequela of prior trauma. The joint spaces are preserved. There is no erosive change. The soft tissues are unremarkable. IMPRESSION: No acute fracture or dislocation. Electronically Signed   By: Valetta Mole M.D.   On: 03/13/2022 11:52    Procedures Procedures (including critical care time)  Medications Ordered in UC Medications - No data to display  Initial Impression / Assessment and Plan / UC Course  I have reviewed the triage vital signs and the nursing notes.  Pertinent labs & imaging results that were available during my care of the patient were reviewed by me and considered in my medical  decision making (see chart for details).     MDM: 1.  Injury of left thumb, initial encounter-left hand x-ray revealed  above. Advised patient of left hand x-ray with hard copy provided to patient.  Advised patient to RICE affected area of left thumb for 30 minutes 3 times daily for the next 3 days.  Advised if symptoms worsen and/or unresolved please follow-up with [facility] orthopedic provider for further evaluation.  Contact information has been provided below.  Final Clinical Impressions(s) / UC Diagnoses   Final diagnoses:  Injury of left thumb, initial encounter     Discharge Instructions      Advised patient of left hand x-ray with hard copy provided to patient.  Advised patient to RICE affected area of left thumb for 30 minutes 3 times daily for the next 3 days.  Advised if symptoms worsen and/or unresolved please follow-up with [facility] health orthopedic provider for further evaluation.  Contact information has been provided below.     ED Prescriptions   None    PDMP not reviewed this encounter.   Eliezer Lofts, [place] 03/13/22 1236

## 2022-03-13 NOTE — Discharge Instructions (Addendum)
Advised patient of left hand x-ray with hard copy provided to patient.  Advised patient to RICE affected area of left thumb for 30 minutes 3 times daily for the next 3 days.  Advised if symptoms worsen and/or unresolved please follow-up with [facility] health orthopedic provider for further evaluation.  Contact information has been provided below.

## 2022-03-15 ENCOUNTER — Other Ambulatory Visit: Payer: Self-pay | Admitting: Family Medicine

## 2022-03-15 DIAGNOSIS — G894 Chronic pain syndrome: Secondary | ICD-10-CM

## 2022-03-18 MED ORDER — OXYCODONE HCL 5 MG PO TABS
5.0000 mg | ORAL_TABLET | Freq: Three times a day (TID) | ORAL | 0 refills | Status: DC | PRN
Start: 2022-03-18 — End: 2022-04-10

## 2022-04-10 ENCOUNTER — Other Ambulatory Visit: Payer: Self-pay | Admitting: Family Medicine

## 2022-04-10 DIAGNOSIS — G894 Chronic pain syndrome: Secondary | ICD-10-CM

## 2022-04-10 MED ORDER — OXYCODONE HCL 5 MG PO TABS: 5.0000 mg | ORAL_TABLET | Freq: Three times a day (TID) | ORAL | 0 refills | Status: DC | PRN

## 2022-04-29 ENCOUNTER — Other Ambulatory Visit: Payer: Self-pay | Admitting: Family Medicine

## 2022-04-29 DIAGNOSIS — G894 Chronic pain syndrome: Secondary | ICD-10-CM

## 2022-04-29 MED ORDER — OXYCODONE HCL 5 MG PO TABS: 5.0000 mg | ORAL_TABLET | Freq: Three times a day (TID) | ORAL | 0 refills | Status: DC | PRN

## 2022-05-24 ENCOUNTER — Other Ambulatory Visit: Payer: Self-pay | Admitting: Family Medicine

## 2022-05-24 DIAGNOSIS — G894 Chronic pain syndrome: Secondary | ICD-10-CM

## 2022-05-27 ENCOUNTER — Encounter: Payer: Self-pay | Admitting: Family Medicine

## 2022-05-27 ENCOUNTER — Ambulatory Visit: Payer: Medicare HMO

## 2022-05-27 ENCOUNTER — Ambulatory Visit
Admission: EM | Admit: 2022-05-27 | Discharge: 2022-05-27 | Disposition: A | Discharge status: Home / Self Care | Payer: Medicare HMO | Attending: Nurse Practitioner | Admitting: Nurse Practitioner

## 2022-05-27 DIAGNOSIS — B349 Viral infection, unspecified: Secondary | ICD-10-CM | POA: Diagnosis not present

## 2022-05-27 DIAGNOSIS — Z1152 Encounter for screening for COVID-19: Secondary | ICD-10-CM | POA: Diagnosis not present

## 2022-05-27 DIAGNOSIS — J029 Acute pharyngitis, unspecified: Secondary | ICD-10-CM | POA: Diagnosis not present

## 2022-05-27 LAB — POCT RAPID STREP A (OFFICE): Rapid Strep A Screen: NEGATIVE

## 2022-05-27 MED ORDER — OXYCODONE HCL 5 MG PO TABS: 5.0000 mg | ORAL_TABLET | Freq: Three times a day (TID) | ORAL | 0 refills | Status: DC | PRN

## 2022-05-27 NOTE — Discharge Instructions (Signed)
Your symptoms and exam are consistent for a viral illness. Please treat your symptoms with over the counter cough medication, tylenol or ibuprofen, humidifier, and rest. Viral illnesses can last 7-14 days. Please follow up with your PCP if your symptoms are not improving. Please go to the ER for any worsening symptoms. This includes but is not limited to fever you can not control with tylenol or ibuprofen, you are not able to stay hydrated, you have shortness of breath or chest pain.  Thank you for choosing [facility] for your healthcare needs. I hope you feel better soon!  

## 2022-05-27 NOTE — ED Triage Notes (Addendum)
Pt c/o sore throat that began Saturday, he states he is now having pain to left ear.  Home interventions: vitamins, asa, tylenol

## 2022-05-27 NOTE — Telephone Encounter (Signed)
Requesting: Oxycodone  Contract: 10/07/2017 UDS: 06/27/2021 Last Visit: 12/12/2021 Next Visit: 06/13/2022 Last Refill: 04/29/2022  Please Advise

## 2022-05-27 NOTE — ED Provider Notes (Signed)
[facility]    CSN: [id number] Arrival date & time: 05/27/22  1033      History   Chief Complaint Chief Complaint  Patient presents with   Sore Throat    HPI Douglas Nelson is a 37 y.o. male  presents for evaluation of URI symptoms for 2 days. Patient reports associated symptoms of sore throat, runny nose as, congestion, ear pain. Denies N/V/D, fevers, cough, body aches, shortness of breath. Patient does not have a hx of asthma or smoking. No known sick contacts.  Pt has taken vitamin C, aspirin, Tylenol, Echinacea OTC for symptoms. Pt has no other concerns at this time.    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 Authorizing Provider  b complex vitamins capsule Take 1 capsule by mouth daily.    [provider]  cetirizine (ZYRTEC) 10 MG tablet Take 1 tablet (10 mg total) by mouth daily. Patient taking differently: Take 10 mg by mouth daily. Pt alternates between Claritin and Zyrtec every 6 months. 03/02/15   Midge Minium, MD  Multiple Vitamin (MULTIVITAMIN) tablet Take 1 tablet by mouth daily.    [provider]  oxyCODONE (OXY IR/ROXICODONE) 5 MG immediate release tablet Take 1 tablet (5 mg total) by mouth every 8 (eight) hours as needed. 05/27/22   Copland, Gay Filler, MD  Probiotic Product (PROBIOTIC ADVANCED PO) Take by mouth.    [provider]  zinc gluconate 50 MG tablet Take 50 mg by mouth daily.    [provider]    Family History Family History  Problem Relation Age of Onset   Cancer Mother        Ovarian   Anuerysm Father    Hypertension Father    Hypertension Maternal Grandmother    Hypertension Maternal Grandfather     Social History Social History   Tobacco Use   Smoking status: Former    Types: Cigarettes    Quit date: 08/30/2010    Years since quitting: 11.7   Smokeless tobacco: Never  Vaping Use   Vaping Use: Never used  Substance Use Topics   Alcohol use: Not Currently    Comment: occasionally   Drug use: No     Allergies   Patient has no known allergies.   Review of Systems Review of Systems  HENT:  Positive for ear pain, rhinorrhea and sore throat.      Physical Exam Triage Vital Signs ED Triage Vitals  Enc Vitals Group     BP 05/27/22 1103 (!) 141/81     Pulse Rate 05/27/22 1103 63     Resp 05/27/22 1103 18     Temp 05/27/22 1103 98.1 F (36.7 C)     Temp Source 05/27/22 1103 Oral     SpO2 05/27/22 1103 97 %  Weight --      Height --      Head Circumference --      Peak Flow --      Pain Score 05/27/22 1102 6     Pain Loc --      Pain Edu? --      Excl. in [place]? --    No data found.  Updated Vital Signs BP (!) 141/81 (BP Location: Right Arm)   Pulse 63   Temp 98.1 F (36.7 C) (Oral)   Resp 18   SpO2 97%   Visual Acuity Right Eye Distance:   Left Eye Distance:   Bilateral Distance:    Right Eye Near:   Left Eye Near:    Bilateral Near:     Physical Exam Vitals and nursing note reviewed.  Constitutional:      General: He is not in acute distress.    Appearance: Normal appearance. He is not ill-appearing or toxic-appearing.  HENT:     Head: Normocephalic and atraumatic.     Right Ear: Tympanic membrane and ear canal normal.     Left Ear: Tympanic membrane and ear canal normal.     Nose: Rhinorrhea present. No congestion.      Mouth/Throat:     Mouth: Mucous membranes are moist.     Pharynx: Posterior oropharyngeal erythema present.  Eyes:     Pupils: Pupils are equal, round, and reactive to light.  Cardiovascular:     Rate and Rhythm: Normal rate and regular rhythm.     Heart sounds: Normal heart sounds.  Pulmonary:     Effort: Pulmonary effort is normal.     Breath sounds: Normal breath sounds.  Musculoskeletal:     Cervical back: Normal range of motion and neck supple.  Lymphadenopathy:     Cervical: No cervical adenopathy.  Skin:    General: Skin is warm and dry.  Neurological:     General: No focal deficit present.     Mental Status: He is alert and oriented to person, place, and time.  Psychiatric:        Mood and Affect: Mood normal.        Behavior: Behavior normal.      UC Treatments / Results  Labs (all labs ordered are listed, but only abnormal results are displayed) Labs Reviewed  CULTURE, GROUP A STREP ([place])  SARS CORONAVIRUS 2 (TAT 6-24 HRS)  POCT RAPID STREP A (OFFICE)    EKG   Radiology No results found.  Procedures Procedures (including critical care time)  Medications Ordered in UC Medications - No data to display  Initial Impression / Assessment and Plan / UC Course  I have reviewed the triage vital signs and the nursing notes.  Pertinent labs & imaging results that were available during my care of the patient were reviewed by me and considered in my medical decision making (see chart for details).     Negative rapid strep, will culture COVID PCR and will contact if positive Discussed viral illness and symptomatic treatment PCP follow-up if symptoms do not improve ER precautions reviewed and patient verbalized understanding Final Clinical Impressions(s) / UC Diagnoses   Final diagnoses:  Sore throat  Viral illness     Discharge Instructions      Your symptoms and exam are consistent for a viral illness. Please treat your symptoms with over the  counter cough medication, tylenol or ibuprofen, humidifier, and rest. Viral illnesses can last 7-14 days. Please follow up with  your PCP if your symptoms are not improving. Please go to the ER for any worsening symptoms. This includes but is not limited to fever you can not control with tylenol or ibuprofen, you are not able to stay hydrated, you have shortness of breath or chest pain.  Thank you for choosing [facility] for your healthcare needs. I hope you feel better soon!    ED Prescriptions   None    PDMP not reviewed this encounter.   Melynda Ripple, NP 05/27/22 1133

## 2022-05-28 LAB — SARS CORONAVIRUS 2 (TAT 6-24 HRS): SARS Coronavirus 2: NEGATIVE

## 2022-05-29 ENCOUNTER — Telehealth: Payer: Medicare HMO | Admitting: Physician Assistant

## 2022-05-29 DIAGNOSIS — J020 Streptococcal pharyngitis: Secondary | ICD-10-CM | POA: Diagnosis not present

## 2022-05-29 MED ORDER — AMOXICILLIN-POT CLAVULANATE 875-125 MG PO TABS: 1.0000 | ORAL_TABLET | Freq: Two times a day (BID) | ORAL | 0 refills | Status: DC

## 2022-05-29 NOTE — Progress Notes (Signed)
E-Visit for Sore Throat - Strep Symptoms  We are sorry that you are not feeling well.  Here is how we plan to help!  Based on what you have shared with me it is likely that you have strep pharyngitis.  Strep pharyngitis is inflammation and infection in the back of the throat.  This is an infection cause by bacteria and is treated with antibiotics.  I have prescribed Augmentin 875-125mg  Take 1 tablet twice daily for 10 days. For throat pain, we recommend over the counter oral pain relief medications such as acetaminophen or aspirin, or anti-inflammatory medications such as ibuprofen or naproxen sodium. Topical treatments such as oral throat lozenges or sprays may be used as needed. Strep infections are not as easily transmitted as other respiratory infections, however we still recommend that you avoid close contact with loved ones, especially the very young and elderly.  Remember to wash your hands thoroughly throughout the day as this is the number one way to prevent the spread of infection and wipe down door knobs and counters with disinfectant.   Home Care: Only take medications as instructed by your medical team. Complete the entire course of an antibiotic. Do not take these medications with alcohol. A steam or ultrasonic humidifier can help congestion.  You can place a towel over your head and breathe in the steam from hot water coming from a faucet. Avoid close contacts especially the very young and the elderly. Cover your mouth when you cough or sneeze. Always remember to wash your hands.  Get Help Right Away If: You develop worsening fever or sinus pain. You develop a severe head ache or visual changes. Your symptoms persist after you have completed your treatment plan.  Make sure you Understand these instructions. Will watch your condition. Will get help right away if you are not doing well or get worse.   Thank you for choosing an e-visit.  Your e-visit answers were reviewed by  a board certified advanced clinical practitioner to complete your personal care plan. Depending upon the condition, your plan could have included both over the counter or prescription medications.  Please review your pharmacy choice. Make sure the pharmacy is open so you can pick up prescription now. If there is a problem, you may contact your provider through [employer] and have the prescription routed to another pharmacy.  Your safety is important to [place]. If you have drug allergies check your prescription carefully.   For the next 24 hours you can use MyChart to ask questions about today's visit, request a non-urgent call back, or ask for a work or school excuse. You will get an email in the next two days asking about your experience. I hope that your e-visit has been valuable and will speed your recovery.  I have spent 5 minutes in review of e-visit questionnaire, review and updating patient chart, medical decision making and response to patient.   Mar Daring, PA-C

## 2022-05-30 LAB — CULTURE, GROUP A STREP (THRC)

## 2022-06-09 NOTE — Progress Notes (Unsigned)
[facility] at [facility] [address] [place], [place] [address] [phone]  Date:  06/13/2022   Name:  Douglas Nelson   DOB:  12-Oct-1985   MRN:  [id number]  PCP:  Darreld Mclean, MD    Chief Complaint: No chief complaint on file.   History of Present Illness:  Douglas Nelson is a 37 y.o. 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  He is now status post subtalar fusion with iliac crest bone graft.  He does use a cane, uses oxycodone for chronic pain.  It is about the same - stable but painful.  He feels like other joints might be getting a bit worse over the years due to compensating for the ankle.  He has tried to get his diet under better control and is getting exercise on a regular basis   He is married to [place], they have 3 children.  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   Left ankle pain 04/15/2016   Hematuria 01/25/2015   Left foot pain 12/16/2013   Overweight (BMI 25.0-29.9) 11/08/2013   Coalition, talocalcaneal 09/16/2011   Allergic rhinitis, seasonal 06/14/2011   S/P foot surgery, left 04/04/2011    Past Medical History:  Diagnosis Date   Chronic foot pain     Past Surgical History:  Procedure Laterality Date   SUBTALAR  DISTRACTION BONE BLOCK FUSION WITH ILIAC CREST BONE GRAFT Left     Social History   Tobacco Use   Smoking status: Former    Types: Cigarettes    Quit date: 08/30/2010    Years since quitting: 11.7   Smokeless tobacco: Never  Vaping Use   Vaping Use: Never used  Substance Use Topics   Alcohol use: Not Currently    Comment: occasionally   Drug use: No    Family History  Problem Relation Age of Onset   Cancer Mother        Ovarian   Anuerysm Father    Hypertension Father    Hypertension Maternal Grandmother    Hypertension Maternal Grandfather     No Known Allergies  Medication list has been reviewed and updated.  Current Outpatient Medications on File Prior to Visit  Medication Sig Dispense Refill   amoxicillin-clavulanate (AUGMENTIN) 875-125 MG tablet Take 1 tablet by mouth 2 (two) times daily. 20 tablet 0   b complex vitamins capsule Take 1 capsule by mouth daily.     cetirizine (ZYRTEC) 10 MG tablet Take 1 tablet (10 mg total) by mouth daily. (Patient taking differently: Take 10 mg by mouth daily. Pt alternates between Claritin and Zyrtec every 6 months.) 90 tablet 1   Multiple Vitamin (MULTIVITAMIN) tablet Take 1 tablet by mouth daily.     oxyCODONE (OXY  IR/ROXICODONE) 5 MG immediate release tablet Take 1 tablet (5 mg total) by mouth every 8 (eight) hours as needed. 30 tablet 0   Probiotic Product (PROBIOTIC ADVANCED PO) Take by mouth.     zinc gluconate 50 MG tablet Take 50 mg by mouth daily.     No current facility-administered medications on file prior to visit.    Review of Systems:  As per HPI- otherwise negative.  Physical Examination: There were no vitals filed for this visit. There were no vitals filed for this visit. There is no height or weight on file to calculate BMI. Ideal Body Weight:    GEN: no acute distress. HEENT: Atraumatic, Normocephalic.  Ears and Nose: No external deformity. CV: RRR, No M/G/R. No JVD. No thrill. No extra heart  sounds. PULM: CTA B, no wheezes, crackles, rhonchi. No retractions. No resp. distress. No accessory muscle use. ABD: S, NT, ND, +BS. No rebound. No HSM. EXTR: No c/c/e PSYCH: Normally interactive. Conversant.    Assessment and Plan: ***  Signed Lamar Blinks, MD

## 2022-06-09 NOTE — Patient Instructions (Incomplete)
It was great to see you again today, assuming all is well lets follow-up in 6 months Please stop by the lab to do a urine sample today When you need your next refill of pain mediation please remind me that we talked about increasing the amount to 60 pills per fill so you can pick up less frequently  [employer] is a good resource to find a therapist

## 2022-06-12 ENCOUNTER — Other Ambulatory Visit: Payer: Self-pay | Admitting: Family Medicine

## 2022-06-12 DIAGNOSIS — G894 Chronic pain syndrome: Secondary | ICD-10-CM

## 2022-06-12 MED ORDER — OXYCODONE HCL 5 MG PO TABS: 5.0000 mg | ORAL_TABLET | Freq: Three times a day (TID) | ORAL | 0 refills | Status: DC | PRN

## 2022-06-13 ENCOUNTER — Ambulatory Visit (INDEPENDENT_AMBULATORY_CARE_PROVIDER_SITE_OTHER): Payer: Medicare HMO | Admitting: Family Medicine

## 2022-06-13 VITALS — BP 110/60 | HR 67 | Temp 97.8°F | Resp 18 | Ht 72.0 in | Wt 195.8 lb

## 2022-06-13 DIAGNOSIS — Z5181 Encounter for therapeutic drug level monitoring: Secondary | ICD-10-CM

## 2022-06-13 DIAGNOSIS — G894 Chronic pain syndrome: Secondary | ICD-10-CM | POA: Diagnosis not present

## 2022-06-13 DIAGNOSIS — B360 Pityriasis versicolor: Secondary | ICD-10-CM

## 2022-06-13 MED ORDER — FLUCONAZOLE 150 MG PO TABS: ORAL_TABLET | ORAL | 0 refills | Status: DC

## 2022-06-14 ENCOUNTER — Telehealth: Payer: Self-pay | Admitting: Family Medicine

## 2022-06-14 NOTE — Telephone Encounter (Signed)
Contacted Douglas Nelson to schedule their annual wellness visit. Appointment made for 06/27/2022. Douglas Nelson; Care Guide Ambulatory Clinical Support [place] [facility] Direct Dial: [phone]

## 2022-06-15 LAB — DRUG MONITORING, PANEL 8 WITH CONFIRMATION, URINE
6 Acetylmorphine: NEGATIVE ng/mL (ref <10)
Alcohol Metabolites: NEGATIVE ng/mL (ref <500)
Amphetamines: NEGATIVE ng/mL (ref <500)
Benzodiazepines: NEGATIVE ng/mL (ref <100)
Buprenorphine, Urine: NEGATIVE ng/mL (ref <5)
Cocaine Metabolite: NEGATIVE ng/mL (ref <150)
Creatinine: 84.2 mg/dL (ref >=20.0)
MDMA: NEGATIVE ng/mL (ref <500)
Marijuana Metabolite: NEGATIVE ng/mL (ref <20)
Noroxycodone: 266 ng/mL — ABNORMAL HIGH (ref <50)
Opiates: NEGATIVE ng/mL (ref <100)
Oxidant: NEGATIVE ug/mL (ref <200)
Oxycodone: NEGATIVE ng/mL (ref <50)
Oxycodone: POSITIVE ng/mL — AB (ref <100)
Oxymorphone: 183 ng/mL — ABNORMAL HIGH (ref <50)
pH: 7.6 (ref 4.5–9.0)

## 2022-06-15 LAB — DM TEMPLATE

## 2022-06-24 ENCOUNTER — Encounter: Payer: Self-pay | Admitting: *Deleted

## 2022-07-03 ENCOUNTER — Other Ambulatory Visit: Payer: Self-pay | Admitting: Family Medicine

## 2022-07-03 DIAGNOSIS — G894 Chronic pain syndrome: Secondary | ICD-10-CM

## 2022-07-04 MED ORDER — OXYCODONE HCL 5 MG PO TABS: 5.0000 mg | ORAL_TABLET | Freq: Three times a day (TID) | ORAL | 0 refills | Status: DC | PRN

## 2022-08-09 ENCOUNTER — Other Ambulatory Visit: Payer: Self-pay | Admitting: Family Medicine

## 2022-08-09 DIAGNOSIS — G894 Chronic pain syndrome: Secondary | ICD-10-CM

## 2022-08-09 MED ORDER — OXYCODONE HCL 5 MG PO TABS: 5.0000 mg | ORAL_TABLET | Freq: Three times a day (TID) | ORAL | 0 refills | Status: DC | PRN

## 2022-08-09 NOTE — Telephone Encounter (Signed)
Patient is requesting a refill of the following medications: Requested Prescriptions   Pending Prescriptions Disp Refills   oxyCODONE (OXY IR/ROXICODONE) 5 MG immediate release tablet 60 tablet 0    Sig: Take 1 tablet (5 mg total) by mouth every 8 (eight) hours as needed.    Date of patient request: 08/09/2022 Last office visit: 06/13/22 Date of last refill: 07/04/22 Last refill amount: 60

## 2022-08-12 ENCOUNTER — Telehealth: Payer: Self-pay | Admitting: Family Medicine

## 2022-08-12 NOTE — Telephone Encounter (Signed)
Copied from CRM [id number]. Topic: Medicare AWV >> Aug 12, 2022  1:38 PM Payton Doughty wrote: Reason for CRM: LM 08/12/2022 to schedule AWV   Verlee Rossetti; Care Guide Ambulatory Clinical Support [place] [facility] Direct Dial: [phone]

## 2022-08-28 ENCOUNTER — Encounter: Payer: Self-pay | Admitting: Family Medicine

## 2022-08-28 DIAGNOSIS — L720 Epidermal cyst: Secondary | ICD-10-CM

## 2022-09-06 ENCOUNTER — Telehealth: Payer: Self-pay | Admitting: Family Medicine

## 2022-09-06 NOTE — Telephone Encounter (Signed)
Please advise 

## 2022-09-06 NOTE — Telephone Encounter (Signed)
Douglas Nelson [facility]) called stating that she needed clarification if an appeal would be done for pt's denied MRI. Per referral notes, P2P would be the only option for this. Douglas stated that a secure chat or message could be sent to her regarding the info.

## 2022-09-10 ENCOUNTER — Ambulatory Visit (HOSPITAL_COMMUNITY): Payer: Medicare HMO

## 2022-09-16 ENCOUNTER — Ambulatory Visit (HOSPITAL_COMMUNITY)
Admission: RE | Admit: 2022-09-16 | Discharge: 2022-09-16 | Disposition: A | Discharge status: Home / Self Care | Payer: Medicare HMO | Source: Ambulatory Visit | Attending: Family Medicine | Admitting: Family Medicine

## 2022-09-16 ENCOUNTER — Other Ambulatory Visit: Payer: Self-pay | Admitting: Family

## 2022-09-16 DIAGNOSIS — L720 Epidermal cyst: Secondary | ICD-10-CM | POA: Diagnosis not present

## 2022-09-16 DIAGNOSIS — L0591 Pilonidal cyst without abscess: Secondary | ICD-10-CM | POA: Diagnosis not present

## 2022-09-16 MED ORDER — GADOBUTROL 1 MMOL/ML IV SOLN
9.0000 mL | Freq: Once | INTRAVENOUS | Status: AC | PRN
  Administered 2022-09-16: 9 mL via INTRAVENOUS

## 2022-09-16 MED ORDER — DIAZEPAM 5 MG PO TABS: 5.0000 mg | ORAL_TABLET | Freq: Once | ORAL | 0 refills | Status: AC

## 2022-09-23 ENCOUNTER — Other Ambulatory Visit: Payer: Self-pay | Admitting: Family Medicine

## 2022-09-23 ENCOUNTER — Encounter: Payer: Self-pay | Admitting: Family Medicine

## 2022-09-23 DIAGNOSIS — G894 Chronic pain syndrome: Secondary | ICD-10-CM

## 2022-09-24 MED ORDER — OXYCODONE HCL 5 MG PO TABS: 5.0000 mg | ORAL_TABLET | Freq: Three times a day (TID) | ORAL | 0 refills | Status: DC | PRN

## 2022-11-01 ENCOUNTER — Encounter: Payer: Medicare HMO | Admitting: *Deleted

## 2022-11-01 NOTE — Progress Notes (Signed)
Pt did not answer phone for AWV.   This encounter was created in error - please disregard.

## 2022-11-04 ENCOUNTER — Other Ambulatory Visit: Payer: Self-pay | Admitting: Family Medicine

## 2022-11-04 DIAGNOSIS — G894 Chronic pain syndrome: Secondary | ICD-10-CM

## 2022-11-05 MED ORDER — OXYCODONE HCL 5 MG PO TABS: 5.0000 mg | ORAL_TABLET | Freq: Three times a day (TID) | ORAL | 0 refills | Status: DC | PRN

## 2022-11-14 ENCOUNTER — Encounter (INDEPENDENT_AMBULATORY_CARE_PROVIDER_SITE_OTHER): Payer: Medicare HMO | Admitting: Family Medicine

## 2022-11-14 DIAGNOSIS — Z7184 Encounter for health counseling related to travel: Secondary | ICD-10-CM | POA: Diagnosis not present

## 2022-11-18 MED ORDER — LEVOFLOXACIN 500 MG PO TABS: 500.0000 mg | ORAL_TABLET | Freq: Every day | ORAL | 0 refills | Status: DC

## 2022-11-18 MED ORDER — ATOVAQUONE-PROGUANIL HCL 250-100 MG PO TABS: 1.0000 | ORAL_TABLET | Freq: Every day | ORAL | 0 refills | Status: DC

## 2022-11-18 NOTE — Telephone Encounter (Signed)

## 2022-12-04 ENCOUNTER — Telehealth: Payer: Self-pay | Admitting: Family Medicine

## 2022-12-04 NOTE — Telephone Encounter (Signed)
Copied from CRM [id number]. Topic: Medicare AWV >> Dec 04, 2022  9:18 AM Payton Doughty wrote: Reason for CRM: LM 12/04/2022 to schedule AWV   Verlee Rossetti; Care Guide Ambulatory Clinical Support [place] [facility] Direct Dial: [phone]

## 2022-12-08 NOTE — Progress Notes (Signed)
[facility] at [facility] [address] [place], [place] [address] [phone]  Date:  12/16/2022   Name:  Douglas Nelson   DOB:  Jun 09, 1985   MRN:  [id number]  PCP:  Pearline Cables, MD    Chief Complaint: Annual Exam (Concerns/ questions: none/AWV due/Flu shot: yes)   History of Present Illness:  Douglas Nelson is a 37 y.o. 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  He is now status post subtalar fusion with iliac crest bone graft.  He does use a cane, uses oxycodone for chronic pain.  It is about the same - stable but painful.  He feels like other joints might be getting a bit worse over the years due to compensating for the ankle.  He has tried to get his diet under better control and is getting exercise on a regular basis   Married to [place], 3 children-the youngest is in high school, the older two are young adults- Douglas Nelson is 59, a Holiday representative at [employer], daughter is 57   He feels oxycodone 5 mg, 60 about once a month-this helps keep his pain under control but does not eliminate his pain His leg is still painful daily, he is able to manage it He does ice therapy daily with an ice boot device He enjoys soaking in an epson salt bath   Flu vaccine- give today  Recommend COVID booster UDS is up-to-date Can update other routine blood work  He notes some weight gain- he has not been as strict with his diet, he was vegan in the past but added back meat again.  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   Left ankle pain 04/15/2016   Hematuria 01/25/2015   Left foot pain 12/16/2013   Overweight (BMI 25.0-29.9) 11/08/2013   Coalition, talocalcaneal 09/16/2011   Allergic rhinitis, seasonal 06/14/2011   S/P foot surgery, left 04/04/2011    Past Medical History:  Diagnosis Date   Chronic foot pain     Past Surgical History:  Procedure Laterality Date   SUBTALAR DISTRACTION BONE BLOCK FUSION WITH ILIAC CREST BONE GRAFT Left     Social History   Tobacco Use   Smoking status: Former    Current packs/day: 0.00    Types: Cigarettes    Quit date: 08/30/2010    Years since quitting: 12.3   Smokeless tobacco: Never  Vaping Use   Vaping status: Never Used  Substance Use Topics   Alcohol use: Not Currently    Comment: occasionally   Drug use: No    Family History  Problem Relation Age of Onset   Cancer Mother        Ovarian   Anuerysm Father  Hypertension Father    Hypertension Maternal Grandmother    Hypertension Maternal Grandfather     No Known Allergies  Medication list has been reviewed and updated.  Current Outpatient Medications on File Prior to Visit  Medication Sig Dispense Refill   b complex vitamins capsule Take 1 capsule by mouth daily.     cetirizine (ZYRTEC) 10 MG tablet Take 1 tablet (10 mg total) by mouth daily. (Patient taking differently: Take 10 mg by mouth daily. Pt alternates between Claritin and Zyrtec every 6 months.) 90 tablet 1   fluconazole (DIFLUCAN) 150 MG tablet Take 2 pills (300 mg) by mouth once, repeat in 1 week 4 tablet 0   Multiple Vitamin (MULTIVITAMIN) tablet Take 1 tablet by mouth daily.     oxyCODONE (OXY IR/ROXICODONE) 5 MG immediate release tablet Take 1 tablet (5 mg total) by mouth every  8 (eight) hours as needed. 60 tablet 0   Probiotic Product (PROBIOTIC ADVANCED PO) Take by mouth.     zinc gluconate 50 MG tablet Take 50 mg by mouth daily.     No current facility-administered medications on file prior to visit.    Review of Systems:  As per HPI- otherwise negative.   Physical Examination: Vitals:   12/16/22 1032  BP: 130/84  Pulse: (!) 52  Resp: 18  Temp: 97.8 F (36.6 C)  SpO2: 97%   Vitals:   12/16/22 1032  Weight: 209 lb 12.8 oz (95.2 kg)  Height: 6' (1.829 m)   Body mass index is 28.45 kg/m. Ideal Body Weight: Weight in (lb) to have BMI = 25: 183.9  GEN: no acute distress.  Normal weight but has gained, looks well HEENT: Atraumatic, Normocephalic.  Ears and Nose: No external deformity. CV: RRR, No M/G/R. No JVD. No thrill. No extra heart sounds. PULM: CTA B, no wheezes, crackles, rhonchi. No retractions. No resp. distress. No accessory muscle use. ABD: S, NT, ND, +BS. No rebound. No HSM. EXTR: No c/c/e PSYCH: Normally interactive. Conversant.    Assessment and Plan: Physical exam  Chronic pain syndrome - Plan: oxyCODONE (OXY IR/ROXICODONE) 5 MG immediate release tablet, oxyCODONE (ROXICODONE) 5 MG immediate release tablet, oxyCODONE (ROXICODONE) 5 MG immediate release tablet  Screening for deficiency anemia - Plan: CBC  Screening for hyperlipidemia - Plan: Lipid panel  Screening for diabetes mellitus - Plan: Comprehensive metabolic panel, Hemoglobin A1c  Immunization due - Plan: Flu vaccine trivalent PF, 6mos and older(Flulaval,Afluria,Fluarix,Fluzone)  Physical exam today.  Encouraged healthy diet and exercise routine Will plan further follow- up pending labs. Refill oxycodone for 3 months Gave flu shot today He will see me in 6 months for follow-up, urine drug screen  Signed Abbe Amsterdam, MD Received labs as below, message to patient  Results for orders placed or performed in visit on 12/16/22  CBC  Result Value Ref Range    WBC 4.9 4.0 - 10.5 K/uL   RBC 5.26 4.22 - 5.81 Mil/uL   Platelets 144.0 (L) 150.0 - 400.0 K/uL   Hemoglobin 15.6 13.0 - 17.0 g/dL   HCT 16.1 09.6 - 04.5 %   MCV 90.3 78.0 - 100.0 fl   MCHC 32.7 30.0 - 36.0 g/dL   RDW 40.9 81.1 - 91.4 %  Comprehensive metabolic panel  Result Value Ref Range   Sodium 140 135 - 145 mEq/L   Potassium 4.7 3.5 - 5.1 mEq/L   Chloride 101 96 - 112 mEq/L   CO2 31 19 - 32 mEq/L   Glucose, Bld 87  70 - 99 mg/dL   BUN 7 6 - 23 mg/dL   Creatinine, Ser 4.40 0.40 - 1.50 mg/dL   Total Bilirubin 1.1 0.2 - 1.2 mg/dL   Alkaline Phosphatase 59 39 - 117 U/L   AST 43 (H) 0 - 37 U/L   ALT 26 0 - 53 U/L   Total Protein 7.0 6.0 - 8.3 g/dL   Albumin 4.7 3.5 - 5.2 g/dL   GFR 347.42 >59.56 mL/min   Calcium 10.2 8.4 - 10.5 mg/dL  Hemoglobin L8V  Result Value Ref Range   Hgb A1c MFr Bld 5.2 4.6 - 6.5 %  Lipid panel  Result Value Ref Range   Cholesterol 251 (H) 0 - 200 mg/dL   Triglycerides 56.4 0.0 - 149.0 mg/dL   HDL 33.29 >51.88 mg/dL   VLDL 41.6 0.0 - 60.6 mg/dL   LDL Cholesterol 301 (H) 0 - 99 mg/dL   Total CHOL/HDL Ratio 3    NonHDL 171.25

## 2022-12-08 NOTE — Patient Instructions (Addendum)
It was great to see you again today, I will be in touch with your labs.  Please see me in about 6 months- we can do labs at that time I sent in 3 months of your oxycodone- the pharmacy can keep on file for you.  Let me know when you need more refills Recommend covid booster this fall Flu shot today!

## 2022-12-16 ENCOUNTER — Encounter: Payer: Self-pay | Admitting: Family Medicine

## 2022-12-16 ENCOUNTER — Ambulatory Visit (INDEPENDENT_AMBULATORY_CARE_PROVIDER_SITE_OTHER): Payer: Medicare HMO | Admitting: Family Medicine

## 2022-12-16 VITALS — BP 115/70 | HR 52 | Temp 97.8°F | Resp 18 | Ht 72.0 in | Wt 209.8 lb

## 2022-12-16 DIAGNOSIS — Z0000 Encounter for general adult medical examination without abnormal findings: Secondary | ICD-10-CM

## 2022-12-16 DIAGNOSIS — Z23 Encounter for immunization: Secondary | ICD-10-CM

## 2022-12-16 DIAGNOSIS — G894 Chronic pain syndrome: Secondary | ICD-10-CM | POA: Diagnosis not present

## 2022-12-16 DIAGNOSIS — Z130 Encounter for screening for diseases of the blood and blood-forming organs and certain disorders involving the immune mechanism: Secondary | ICD-10-CM | POA: Diagnosis not present

## 2022-12-16 DIAGNOSIS — Z131 Encounter for screening for diabetes mellitus: Secondary | ICD-10-CM | POA: Diagnosis not present

## 2022-12-16 DIAGNOSIS — Z13220 Encounter for screening for lipoid disorders: Secondary | ICD-10-CM | POA: Diagnosis not present

## 2022-12-16 LAB — CBC
HCT: 47.5 % (ref 39.0–52.0)
Hemoglobin: 15.6 g/dL (ref 13.0–17.0)
MCHC: 32.7 g/dL (ref 30.0–36.0)
MCV: 90.3 fL (ref 78.0–100.0)
Platelets: 144 10*3/uL — ABNORMAL LOW (ref 150.0–400.0)
RBC: 5.26 Mil/uL (ref 4.22–5.81)
RDW: 13.2 % (ref 11.5–15.5)
WBC: 4.9 10*3/uL (ref 4.0–10.5)

## 2022-12-16 LAB — LIPID PANEL
Cholesterol: 251 mg/dL — ABNORMAL HIGH (ref 0–200)
HDL: 79.4 mg/dL (ref >39.00)
LDL Cholesterol: 156 mg/dL — ABNORMAL HIGH (ref 0–99)
NonHDL: 171.25
Total CHOL/HDL Ratio: 3
Triglycerides: 76 mg/dL (ref 0.0–149.0)
VLDL: 15.2 mg/dL (ref 0.0–40.0)

## 2022-12-16 LAB — COMPREHENSIVE METABOLIC PANEL
ALT: 26 U/L (ref 0–53)
AST: 43 U/L — ABNORMAL HIGH (ref 0–37)
Albumin: 4.7 g/dL (ref 3.5–5.2)
Alkaline Phosphatase: 59 U/L (ref 39–117)
BUN: 7 mg/dL (ref 6–23)
CO2: 31 meq/L (ref 19–32)
Calcium: 10.2 mg/dL (ref 8.4–10.5)
Chloride: 101 meq/L (ref 96–112)
Creatinine, Ser: 0.86 mg/dL (ref 0.40–1.50)
GFR: 111.54 mL/min (ref >60.00)
Glucose, Bld: 87 mg/dL (ref 70–99)
Potassium: 4.7 meq/L (ref 3.5–5.1)
Sodium: 140 meq/L (ref 135–145)
Total Bilirubin: 1.1 mg/dL (ref 0.2–1.2)
Total Protein: 7 g/dL (ref 6.0–8.3)

## 2022-12-16 LAB — HEMOGLOBIN A1C: Hgb A1c MFr Bld: 5.2 % (ref 4.6–6.5)

## 2022-12-16 MED ORDER — OXYCODONE HCL 5 MG PO TABS
5.0000 mg | ORAL_TABLET | Freq: Three times a day (TID) | ORAL | 0 refills | Status: DC | PRN
Start: 2022-12-16 — End: 2023-09-08

## 2022-12-16 MED ORDER — OXYCODONE HCL 5 MG PO TABS: 5.0000 mg | ORAL_TABLET | Freq: Three times a day (TID) | ORAL | 0 refills | Status: DC | PRN

## 2022-12-16 MED ORDER — OXYCODONE HCL 5 MG PO TABS
5.0000 mg | ORAL_TABLET | Freq: Three times a day (TID) | ORAL | 0 refills | Status: DC | PRN
Start: 2022-12-16 — End: 2023-10-16

## 2022-12-27 ENCOUNTER — Encounter: Payer: Self-pay | Admitting: Family Medicine

## 2023-03-16 ENCOUNTER — Other Ambulatory Visit: Payer: Self-pay | Admitting: Family Medicine

## 2023-03-16 DIAGNOSIS — G894 Chronic pain syndrome: Secondary | ICD-10-CM

## 2023-03-17 MED ORDER — OXYCODONE HCL 5 MG PO TABS: 5.0000 mg | ORAL_TABLET | Freq: Three times a day (TID) | ORAL | 0 refills | Status: DC | PRN

## 2023-03-28 DIAGNOSIS — H5213 Myopia, bilateral: Secondary | ICD-10-CM | POA: Diagnosis not present

## 2023-04-23 ENCOUNTER — Other Ambulatory Visit: Payer: Self-pay | Admitting: Family Medicine

## 2023-04-23 DIAGNOSIS — G894 Chronic pain syndrome: Secondary | ICD-10-CM

## 2023-04-23 MED ORDER — OXYCODONE HCL 5 MG PO TABS: 5.0000 mg | ORAL_TABLET | Freq: Three times a day (TID) | ORAL | 0 refills | Status: DC | PRN

## 2023-06-04 ENCOUNTER — Other Ambulatory Visit: Payer: Self-pay | Admitting: Family Medicine

## 2023-06-04 DIAGNOSIS — G894 Chronic pain syndrome: Secondary | ICD-10-CM

## 2023-06-04 MED ORDER — OXYCODONE HCL 5 MG PO TABS: 5.0000 mg | ORAL_TABLET | Freq: Three times a day (TID) | ORAL | 0 refills | Status: DC | PRN

## 2023-06-11 NOTE — Progress Notes (Deleted)
 [facility] [address] [place], [place] [address] [phone]  Date:  06/16/2023   Name:  Douglas Nelson   DOB:  Jul 24, 1985   MRN:  [id number]  PCP:  Pearline Cables, MD    Chief Complaint: No chief complaint on file.   History of Present Illness:  Douglas M Delay is a 38 y.o. very pleasant male patient who presents with the following:  Patient seen today for periodic recheck.  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  He is now status post subtalar fusion with iliac crest bone graft.  He does use a cane, uses oxycodone for chronic pain.  It is about the same - stable but painful.  He feels like other joints might be getting a bit worse over the years due to compensating for the ankle.  He has tried to get his diet under better control and is getting exercise on a regular basis    Married to [place], 3 children-the youngest is in high school, the older two are young adults- [name] is 63, a Holiday representative at [employer], daughter is 68    He feels oxycodone 5 mg, 60 about once a month-this helps keep his pain under control but does not eliminate his pain  We got labs in October-AST was minimally elevated, can update today Due for UDS today  oxycodone  Past Medical History:  Diagnosis Date   Chronic foot pain     Past Surgical History:  Procedure Laterality Date   SUBTALAR DISTRACTION BONE BLOCK FUSION WITH ILIAC CREST BONE GRAFT Left     Social History   Tobacco Use   Smoking status: Former    Current packs/day: 0.00    Types: Cigarettes    Quit date: 08/30/2010    Years since quitting: 12.7   Smokeless tobacco: Never  Vaping Use   Vaping status: Never Used  Substance Use Topics   Alcohol use: Not Currently    Comment: occasionally   Drug use: No    Family History  Problem Relation Age of Onset    Cancer Mother        Ovarian   Anuerysm Father    Hypertension Father    Hypertension Maternal Grandmother    Hypertension Maternal Grandfather     No Known Allergies  Medication list has been reviewed and updated.  Current Outpatient Medications on File Prior to Visit  Medication Sig Dispense Refill   b complex vitamins capsule Take 1 capsule by mouth daily.     cetirizine (ZYRTEC) 10 MG tablet Take 1 tablet (10 mg total) by mouth daily. (Patient taking differently: Take 10 mg by mouth daily. Pt alternates between Claritin and Zyrtec every 6 months.) 90 tablet 1   fluconazole (DIFLUCAN) 150 MG tablet Take 2 pills (300 mg) by mouth once, repeat in 1 week 4 tablet 0   Multiple Vitamin (MULTIVITAMIN) tablet Take 1 tablet by mouth daily.     oxyCODONE (OXY IR/ROXICODONE) 5 MG immediate release tablet Take 1 tablet (5 mg total) by mouth every 8 (eight) hours as needed. 60 tablet 0   oxyCODONE (ROXICODONE) 5 MG immediate release tablet Take 1 tablet (5 mg total) by mouth every 8 (eight) hours as needed for severe pain. 60 tablet 0   oxyCODONE (ROXICODONE) 5 MG immediate release tablet Take 1  tablet (5 mg total) by mouth every 8 (eight) hours as needed for severe pain. 60 tablet 0   Probiotic Product (PROBIOTIC ADVANCED PO) Take by mouth.     zinc gluconate 50 MG tablet Take 50 mg by mouth daily.     No current facility-administered medications on file prior to visit.    Review of Systems:  As per HPI- otherwise negative.   Physical Examination: There were no vitals filed for this visit. There were no vitals filed for this visit. There is no height or weight on file to calculate BMI. Ideal Body Weight:    GEN: no acute distress. HEENT: Atraumatic, Normocephalic.  Ears and Nose: No external deformity. CV: RRR, No M/G/R. No JVD. No thrill. No extra heart sounds. PULM: CTA B, no wheezes, crackles, rhonchi. No retractions. No resp. distress. No accessory muscle use. ABD: S, NT,  ND, +BS. No rebound. No HSM. EXTR: No c/c/e PSYCH: Normally interactive. Conversant.    Assessment and Plan: ***  Signed Abbe Amsterdam, MD

## 2023-06-16 ENCOUNTER — Ambulatory Visit: Payer: Medicare HMO | Admitting: Family Medicine

## 2023-06-16 DIAGNOSIS — R7401 Elevation of levels of liver transaminase levels: Secondary | ICD-10-CM

## 2023-06-16 DIAGNOSIS — G894 Chronic pain syndrome: Secondary | ICD-10-CM

## 2023-07-18 ENCOUNTER — Encounter: Payer: Self-pay | Admitting: Family Medicine

## 2023-07-18 ENCOUNTER — Other Ambulatory Visit: Payer: Self-pay | Admitting: Family Medicine

## 2023-07-18 DIAGNOSIS — B360 Pityriasis versicolor: Secondary | ICD-10-CM

## 2023-07-18 DIAGNOSIS — G894 Chronic pain syndrome: Secondary | ICD-10-CM

## 2023-07-18 MED ORDER — OXYCODONE HCL 5 MG PO TABS: 5.0000 mg | ORAL_TABLET | Freq: Three times a day (TID) | ORAL | 0 refills | Status: DC | PRN

## 2023-07-31 MED ORDER — FLUCONAZOLE 150 MG PO TABS: ORAL_TABLET | ORAL | 0 refills | Status: AC

## 2023-09-08 ENCOUNTER — Encounter: Payer: Self-pay | Admitting: Family Medicine

## 2023-09-08 DIAGNOSIS — G894 Chronic pain syndrome: Secondary | ICD-10-CM

## 2023-09-08 MED ORDER — OXYCODONE HCL 5 MG PO TABS: 5.0000 mg | ORAL_TABLET | Freq: Three times a day (TID) | ORAL | 0 refills | Status: DC | PRN

## 2023-10-13 NOTE — Progress Notes (Unsigned)
 [facility] at [facility] [address], [address] [place], [place] [address] [phone]  Date:  10/16/2023   Name:  Douglas M Molenda   DOB:  [birth year]   MRN:  [id number]  PCP:  Watt Harlene BROCKS, MD    Chief Complaint: No chief complaint on file.   History of Present Illness:  Douglas Nelson is a 38 y.o. very pleasant male patient who presents with the following:  Patient seen today virtually for medication follow-up Patient location is home, location is office.  Patient identity confirmed with 2 factors, he gives consent for virtual visit today.  The patient and myself are present on the call today Our most recent visit was in person in October.  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  He is now status post subtalar fusion with iliac crest bone graft.  He does use a cane, uses oxycodone  for chronic pain.  It is about the same - stable but painful.  He feels like other joints might be getting a bit worse over the years due to compensating for the ankle.  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   Left ankle pain 04/15/2016   Hematuria 01/25/2015   Left foot pain 12/16/2013   Overweight (BMI 25.0-29.9) 11/08/2013   Coalition, talocalcaneal 09/16/2011   Allergic rhinitis, seasonal 06/14/2011   S/P foot surgery, left 04/04/2011    Past Medical History:  Diagnosis Date   Chronic foot pain      Past Surgical History:  Procedure Laterality Date   SUBTALAR DISTRACTION BONE BLOCK FUSION WITH ILIAC CREST BONE GRAFT Left     Social History   Tobacco Use   Smoking status: Former    Current packs/day: 0.00    Types: Cigarettes    Quit date: 08/30/2010    Years since quitting: 13.1   Smokeless tobacco: Never  Vaping Use   Vaping status: Never Used  Substance Use Topics   Alcohol use: Not Currently    Comment: occasionally   Drug use: No    Family History  Problem Relation Age of Onset   Cancer Mother        Ovarian   Anuerysm Father    Hypertension Father    Hypertension Maternal Grandmother    Hypertension Maternal Grandfather     No Known Allergies  Medication list has been reviewed and updated.  Current Outpatient Medications on File Prior to Visit  Medication Sig Dispense Refill   b complex vitamins capsule Take 1 capsule by mouth daily.     cetirizine  (ZYRTEC ) 10 MG tablet Take 1 tablet (10 mg total) by mouth daily. (Patient taking differently: Take 10 mg by mouth daily. Pt alternates between Claritin  and Zyrtec  every 6 months.) 90 tablet 1   fluconazole  (DIFLUCAN ) 150 MG tablet Take 2 pills (300 mg) by mouth once, repeat in  1 week 4 tablet 0   Multiple Vitamin (MULTIVITAMIN) tablet Take 1 tablet by mouth daily.     oxyCODONE  (OXY IR/ROXICODONE ) 5 MG immediate release tablet Take 1 tablet (5 mg total) by mouth every 8 (eight) hours as needed. 60 tablet 0   oxyCODONE  (ROXICODONE ) 5 MG immediate release tablet Take 1 tablet (5 mg total) by mouth every 8 (eight) hours as needed for severe pain. 60 tablet 0   oxyCODONE  (ROXICODONE ) 5 MG immediate release tablet Take 1 tablet (5 mg total) by mouth every 8 (eight) hours as needed for severe pain (pain score 7-10). 60 tablet 0   Probiotic Product (PROBIOTIC ADVANCED PO) Take by mouth.     zinc gluconate 50 MG tablet Take 50 mg by mouth daily.     No current facility-administered medications on file prior to visit.     Review of Systems:  As per HPI- otherwise negative.   Physical Examination: There were no vitals filed for this visit. There were no vitals filed for this visit. There is no height or weight on file to calculate BMI. Ideal Body Weight:    ***  Assessment and Plan: ***  Signed Harlene Schroeder, MD

## 2023-10-16 ENCOUNTER — Encounter: Payer: Self-pay | Admitting: Family Medicine

## 2023-10-16 ENCOUNTER — Telehealth (INDEPENDENT_AMBULATORY_CARE_PROVIDER_SITE_OTHER): Admitting: Family Medicine

## 2023-10-16 VITALS — BP 117/70 | Ht 72.0 in | Wt 193.0 lb

## 2023-10-16 DIAGNOSIS — G894 Chronic pain syndrome: Secondary | ICD-10-CM

## 2023-10-16 DIAGNOSIS — M25512 Pain in left shoulder: Secondary | ICD-10-CM

## 2023-10-16 DIAGNOSIS — G8929 Other chronic pain: Secondary | ICD-10-CM

## 2023-10-16 MED ORDER — OXYCODONE HCL 5 MG PO TABS: 5.0000 mg | ORAL_TABLET | Freq: Three times a day (TID) | ORAL | 0 refills | Status: AC | PRN

## 2023-10-16 MED ORDER — OXYCODONE HCL 5 MG PO TABS: 5.0000 mg | ORAL_TABLET | Freq: Three times a day (TID) | ORAL | 0 refills | Status: DC | PRN

## 2023-11-11 ENCOUNTER — Ambulatory Visit (INDEPENDENT_AMBULATORY_CARE_PROVIDER_SITE_OTHER)

## 2023-11-11 VITALS — Ht 72.0 in | Wt 193.0 lb

## 2023-11-11 DIAGNOSIS — Z0000 Encounter for general adult medical examination without abnormal findings: Secondary | ICD-10-CM

## 2023-11-11 NOTE — Progress Notes (Signed)
 Subjective:   Douglas Nelson is a 38 y.o. who presents for a Medicare Wellness preventive visit.  As a reminder, Annual Wellness Visits don't include a physical exam, and some assessments may be limited, especially if this visit is performed virtually. We may recommend an in-person follow-up visit with your provider if needed.  Visit Complete: Virtual I connected with  Douglas Nelson on 11/11/23 by a audio enabled telemedicine application and verified that I am speaking with the correct person using two identifiers.  Patient Location: Home  Provider Location: Home Office  I discussed the limitations of evaluation and management by telemedicine. The patient expressed understanding and agreed to proceed.  Vital Signs: Because this visit was a virtual/telehealth visit, some criteria may be missing or patient reported. Any vitals not documented were not able to be obtained and vitals that have been documented are patient reported.  VideoDeclined- This patient declined Librarian, academic. Therefore the visit was completed with audio only.  Persons Participating in Visit: Patient.  AWV Questionnaire: No: Patient Medicare AWV questionnaire was not completed prior to this visit.  Cardiac Risk Factors include: male gender     Objective:    Today's Vitals   11/11/23 1410  Weight: 193 lb (87.5 kg)  Height: 6' (1.829 m)   Body mass index is 26.18 kg/m.     11/11/2023    2:12 PM 06/14/2021   11:32 AM 05/07/2021    1:25 PM  Advanced Directives  Does Patient Have a Medical Advance Directive? No Yes Yes  Type of Advance Directive  Living will Healthcare Power of [place];Living will  Does patient want to make changes to medical advance directive?   No - Patient declined  Copy of Healthcare Power of Attorney in Chart?   Yes - validated most recent copy scanned in chart (See row information)  Would patient like information on creating a medical advance directive?  Yes (MAU/Ambulatory/Procedural Areas - Information given)      Current Medications (verified) Outpatient Encounter Medications as of 11/11/2023  Medication Sig   b complex vitamins capsule Take 1 capsule by mouth daily.   cetirizine  (ZYRTEC ) 10 MG tablet Take 1 tablet (10 mg total) by mouth daily. (Patient taking differently: Take 10 mg by mouth daily. Pt alternates between Claritin  and Zyrtec  every 6 months.)   fluconazole  (DIFLUCAN ) 150 MG tablet Take 2 pills (300 mg) by mouth once, repeat in 1 week   Multiple Vitamin (MULTIVITAMIN) tablet Take 1 tablet by mouth daily.   oxyCODONE  (OXY IR/ROXICODONE ) 5 MG immediate release tablet Take 1 tablet (5 mg total) by mouth every 8 (eight) hours as needed.   oxyCODONE  (ROXICODONE ) 5 MG immediate release tablet Take 1 tablet (5 mg total) by mouth every 8 (eight) hours as needed for severe pain (pain score 7-10).   oxyCODONE  (ROXICODONE ) 5 MG immediate release tablet Take 1 tablet (5 mg total) by mouth every 8 (eight) hours as needed for severe pain (pain score 7-10).   Probiotic Product (PROBIOTIC ADVANCED PO) Take by mouth.   zinc gluconate 50 MG tablet Take 50 mg by mouth daily.   No facility-administered encounter medications on file as of 11/11/2023.    Allergies (verified) Patient has no known allergies.   History: Past Medical History:  Diagnosis Date   Anxiety 2021   Don't know if its anything clinical, noticed increase.   Arthritis 2013   Chronic foot pain    Past Surgical History:  Procedure Laterality Date  FRACTURE SURGERY  2003   Left hand/wrist   SUBTALAR DISTRACTION BONE BLOCK FUSION WITH ILIAC CREST BONE GRAFT Left    Family History  Problem Relation Age of Onset   Cancer Mother        Ovarian   Depression Mother    Anuerysm Father    Hypertension Father    Arthritis Father    Cancer Father    Hypertension Maternal Grandmother    Diabetes Maternal Grandmother    Hypertension Maternal Grandfather    Stroke Maternal  Grandfather    Depression Sister    Depression Brother    Social History   Socioeconomic History   Marital status: Married    Spouse name: Not on file   Number of children: Not on file   Years of education: Not on file   Highest education level: Some college, no degree  Occupational History   Not on file  Tobacco Use   Smoking status: Former    Current packs/day: 0.00    Types: Cigarettes    Quit date: 08/30/2010    Years since quitting: 13.2   Smokeless tobacco: Never  Vaping Use   Vaping status: Never Used  Substance and Sexual Activity   Alcohol use: Not Currently    Comment: occasionally   Drug use: No   Sexual activity: Yes    Birth control/protection: None  Other Topics Concern   Not on file  Social History Narrative   Not on file   Social Drivers of Health   Financial Resource Strain: Medium Risk (11/11/2023)   Overall Financial Resource Strain (CARDIA)    Difficulty of Paying Living Expenses: Somewhat hard  Food Insecurity: No Food Insecurity (11/11/2023)   Hunger Vital Sign    Worried About Running Out of Food in the Last Year: Never true    Ran Out of Food in the Last Year: Never true  Transportation Needs: No Transportation Needs (11/11/2023)   PRAPARE - Administrator, Civil Service (Medical): No    Lack of Transportation (Non-Medical): No  Physical Activity: Insufficiently Active (11/11/2023)   Exercise Vital Sign    Days of Exercise per Week: 3 days    Minutes of Exercise per Session: 30 min  Stress: No Stress Concern Present (11/11/2023)   [employer] of Occupational Health - Occupational Stress Questionnaire    Feeling of Stress: Only a little  Social Connections: Socially Integrated (11/11/2023)   Social Connection and Isolation Panel    Frequency of Communication with Friends and Family: More than three times a week    Frequency of Social Gatherings with Friends and Family: Once a week    Attends Religious Services: More than 4 times per  year    Active Member of [employer] or Organizations: Yes    Attends Engineer, structural: More than 4 times per year    Marital Status: Married    Tobacco Counseling Counseling given: Not Answered    Clinical Intake:  Pre-visit preparation completed: Yes  Pain : No/denies pain  Diabetes: No  Lab Results  Component Value Date   HGBA1C 5.2 12/16/2022   HGBA1C 5.4 12/12/2021   HGBA1C 5.4 12/11/2020     How often do you need to have someone help you when you read instructions, pamphlets, or other written materials from your doctor or pharmacy?: 1 - Never  Interpreter Needed?: No  Information entered by :: Charmaine Bloodgood LPN   Activities of Daily Living     11/11/2023  2:10 PM  In your present state of health, do you have any difficulty performing the following activities:  Hearing? 0  Vision? 0  Difficulty concentrating or making decisions? 0  Walking or climbing stairs? 1  Dressing or bathing? 0  Doing errands, shopping? 1  Preparing Food and eating ? N  Using the Toilet? N  In the past six months, have you accidently leaked urine? N  Do you have problems with loss of bowel control? N  Managing your Medications? N  Managing your Finances? N  Housekeeping or managing your Housekeeping? N    Patient Care Team: Copland, Harlene BROCKS, MD as PCP - General (Family Medicine)  I have updated your Care Teams any recent Medical Services you may have received from other providers in the past year.     Assessment:   This is a routine wellness examination for Douglas.  Hearing/Vision screen Hearing Screening - Comments:: Denies hearing difficulties   Vision Screening - Comments:: Wears rx glasses - up to date with routine eye exams with Dr. Milissa    Goals Addressed             This Visit's Progress    Maintain health and activity level   On track      Depression Screen     11/11/2023    2:18 PM 12/16/2022   10:39 AM 06/14/2021   11:34 AM 12/11/2020     9:49 AM 02/04/2018    3:34 PM 11/25/2016    9:44 AM 11/10/2014   10:31 AM  PHQ 2/9 Scores  PHQ - 2 Score 0 0 0 0 0 0 0  Exception Documentation      Patient refusal     Fall Risk     11/11/2023    2:12 PM 12/16/2022   10:39 AM 06/14/2021   11:33 AM 04/04/2021    2:40 PM 11/25/2016    9:44 AM  Fall Risk   Falls in the past year? 0 0 0 0 Yes   Number falls in past yr: 0 0 0 0 2 or more   Injury with Fall? 0 0 0 0 No   Risk for fall due to : Impaired mobility No Fall Risks Impaired mobility    Follow up Falls prevention discussed;Education provided;Falls evaluation completed Falls evaluation completed Falls evaluation completed;Education provided;Falls prevention discussed   Falls prevention discussed      Data saved with a previous flowsheet row definition    MEDICARE RISK AT HOME:  Medicare Risk at Home Any stairs in or around the home?: No If so, are there any without handrails?: No Home free of loose throw rugs in walkways, pet beds, electrical cords, etc?: Yes Adequate lighting in your home to reduce risk of falls?: Yes Life alert?: No Use of a cane, walker or w/c?: Yes Grab bars in the bathroom?: Yes Shower chair or bench in shower?: No Elevated toilet seat or a handicapped toilet?: Yes  TIMED UP AND GO:  Was the test performed?  No  Cognitive Function: 6CIT completed        11/11/2023    2:12 PM 06/14/2021   11:36 AM  6CIT Screen  What Year? 0 points 0 points  What month? 0 points 0 points  What time? 0 points 0 points  Count back from 20 0 points 0 points  Months in reverse 0 points 0 points  Repeat phrase 0 points 0 points  Total Score 0 points 0 points    Immunizations  Immunization History  Administered Date(s) Administered   Influenza, Seasonal, Injecte, Preservative Fre 12/16/2022   Influenza,inj,Quad PF,6+ Mos 11/25/2016, 12/01/2017, 12/07/2018, 12/08/2019, 12/11/2020, 12/12/2021   Moderna Covid-19 Vaccine Bivalent Booster 105yrs & up 11/28/2020   Moderna  Sars-Covid-2 Vaccination 05/31/2019, 06/28/2019, 01/05/2020   Tdap 11/22/2015    Screening Tests Health Maintenance  Topic Date Due   Hepatitis B Vaccines 19-59 Average Risk (1 of 3 - 19+ 3-dose series) Never done   HPV VACCINES (1 - 3-dose SCDM series) Never done   INFLUENZA VACCINE  10/10/2023   COVID-19 Vaccine (5 - 2025-26 season) 11/10/2023   Medicare Annual Wellness (AWV)  11/10/2024   DTaP/Tdap/Td (2 - Td or Tdap) 11/21/2025   Hepatitis C Screening  Completed   HIV Screening  Completed   Pneumococcal Vaccine  Aged Out   Meningococcal B Vaccine  Aged Out    Health Maintenance  Health Maintenance Due  Topic Date Due   Hepatitis B Vaccines 19-59 Average Risk (1 of 3 - 19+ 3-dose series) Never done   HPV VACCINES (1 - 3-dose SCDM series) Never done   INFLUENZA VACCINE  10/10/2023   COVID-19 Vaccine (5 - 2025-26 season) 11/10/2023    Additional Screening:  Vision Screening: Recommended annual ophthalmology exams for early detection of glaucoma and other disorders of the eye. Would you like a referral to an eye doctor? No    Dental Screening: Recommended annual dental exams for proper oral hygiene  Community Resource Referral / Chronic Care Management: CRR required this visit?  No   CCM required this visit?  No   Plan:    I have personally reviewed and noted the following in the patient's chart:   Medical and social history Use of alcohol, tobacco or illicit drugs  Current medications and supplements including opioid prescriptions. Patient is currently taking opioid prescriptions. Information provided to patient regarding non-opioid alternatives. Patient advised to discuss non-opioid treatment plan with their provider. Functional ability and status Nutritional status Physical activity Advanced directives List of other physicians Hospitalizations, surgeries, and ER visits in previous 12 months Vitals Screenings to include cognitive, depression, and  falls Referrals and appointments  In addition, I have reviewed and discussed with patient certain preventive protocols, quality metrics, and best practice recommendations. A written personalized care plan for preventive services as well as general preventive health recommendations were provided to patient.   Lavelle Pfeiffer [place], [place]   [id number]   After Visit Summary: (MyChart) Due to this being a telephonic visit, the after visit summary with patients personalized plan was offered to patient via MyChart   Notes: Nothing significant to report at this time.

## 2023-11-11 NOTE — Patient Instructions (Addendum)
 Mr. Bielinski , Thank you for taking time out of your busy schedule to complete your Annual Wellness Visit with me. I enjoyed our conversation and look forward to speaking with you again next year. I, as well as your care team,  appreciate your ongoing commitment to your health goals. Please review the following plan we discussed and let me know if I can assist you in the future. Your Game plan/ To Do List      Follow up Visits: We will see or speak with you next year for your Next Medicare AWV with our clinical staff Have you seen your provider in the last 6 months (3 months if uncontrolled diabetes)? Yes  Clinician Recommendations:  Aim for 30 minutes of exercise or brisk walking, 6-8 glasses of water, and 5 servings of fruits and vegetables each day.       This is a list of the screenings recommended for you:  Health Maintenance  Topic Date Due   Hepatitis B Vaccine (1 of 3 - 19+ 3-dose series) Never done   HPV Vaccine (1 - 3-dose SCDM series) Never done   Flu Shot  10/10/2023   COVID-19 Vaccine (5 - 2025-26 season) 11/10/2023   Medicare Annual Wellness Visit  11/10/2024   DTaP/Tdap/Td vaccine (2 - Td or Tdap) 11/21/2025   Hepatitis C Screening  Completed   HIV Screening  Completed   Pneumococcal Vaccine  Aged Out   Meningitis B Vaccine  Aged Out    Advanced directives: (ACP Link)Information on Advanced Care Planning can be found at [place]  [employer] [facility] Advance Health Care Directives Advance Health Care Directives. [email or web address]  Advance Care Planning is important because it:  [x]  Makes sure you receive the medical care that is consistent with your values, goals, and preferences  [x]  It provides guidance to your family and loved ones and reduces their decisional burden about whether or not they are making the right decisions based on your wishes.  Follow the link provided in your after visit summary or read over the paperwork we have mailed to you to help you started  getting your Advance Directives in place. If you need assistance in completing these, please reach out to us  so that we can help you!  See attachments for Preventive Care and Fall Prevention Tips.    Information found for at home exercises from physical therapy:   Access Code: [facility] URL: [email or web address] Date: 07/12/2021 Prepared by: Elijah Hidden   Exercises - Seated Table Hamstring Stretch  - 2 x daily - 7 x weekly - 2 sets - 2 reps - 30 second hold - Seated Table Piriformis Stretch  - 2 x daily - 7 x weekly - 2 sets - 2 reps - 30 sec hold - Active Hip Flexor Stretch  - 2 x daily - 7 x weekly - 1 sets - 3 reps - 30-60 sec hold - Hip Abduction with Resistance Loop  - 1 x daily - 3 x weekly - 3 sets - 10 reps - Hip Extension with Resistance Loop  - 1 x daily - 3 x weekly - 3 sets - 10 reps - Standing Hip Adduction with Resistance  - 1 x daily - 3 x weekly - 3 sets - 10 reps - Marching with Resistance  - 1 x daily - 3 x weekly - 2 sets - 10 reps - 3 sec hold - Bird Dog  - 1 x daily - 3 x weekly - 3 sets - 10  reps - 5 sec hold - Standing Single Leg Hip ER   - 1 x daily - 3 x weekly - 3 sets - 10 reps - Seated Hip Internal Rotation with Ball and Resistance  - 1 x daily - 3 x weekly - 3 sets - 10 reps - Bridge with Hip Abduction and Resistance  - 1 x daily - 3 x weekly - 2 sets - 10 reps - 5 sec hold - Side Plank with Clam and Resistance  - 1 x daily - 3 x weekly - 2 sets - 10 reps - 3 sec hold - Single Leg Bridge  - 1 x daily - 3 x weekly - 2 sets - 10 reps - 3 sec hold - Quadruped Hip Abduction with Resistance Loop  - 1 x daily - 3 x weekly - 2 sets - 10 reps - 3 sec hold - Squat with Chair Touch and Resistance Loop  - 1 x daily - 3 x weekly - 2 sets - 10 reps - 3 sec hold - Single Leg Lunge with Foot on Bench  - 1 x daily - 3 x weekly - 2 sets - 10 reps - 3 sec hold

## 2023-11-26 DIAGNOSIS — M62830 Muscle spasm of back: Secondary | ICD-10-CM | POA: Diagnosis not present

## 2023-11-26 DIAGNOSIS — M9901 Segmental and somatic dysfunction of cervical region: Secondary | ICD-10-CM | POA: Diagnosis not present

## 2023-11-26 DIAGNOSIS — M9902 Segmental and somatic dysfunction of thoracic region: Secondary | ICD-10-CM | POA: Diagnosis not present

## 2023-11-26 DIAGNOSIS — M9903 Segmental and somatic dysfunction of lumbar region: Secondary | ICD-10-CM | POA: Diagnosis not present

## 2023-11-27 ENCOUNTER — Encounter: Payer: Self-pay | Admitting: Family Medicine

## 2023-11-27 DIAGNOSIS — G894 Chronic pain syndrome: Secondary | ICD-10-CM

## 2023-11-27 MED ORDER — OXYCODONE HCL 5 MG PO TABS: 5.0000 mg | ORAL_TABLET | Freq: Three times a day (TID) | ORAL | 0 refills | Status: AC | PRN

## 2023-11-27 MED ORDER — OXYCODONE HCL 5 MG PO TABS: 5.0000 mg | ORAL_TABLET | Freq: Three times a day (TID) | ORAL | 0 refills | Status: DC | PRN

## 2023-11-28 DIAGNOSIS — M9902 Segmental and somatic dysfunction of thoracic region: Secondary | ICD-10-CM | POA: Diagnosis not present

## 2023-11-28 DIAGNOSIS — M9903 Segmental and somatic dysfunction of lumbar region: Secondary | ICD-10-CM | POA: Diagnosis not present

## 2023-11-28 DIAGNOSIS — M9901 Segmental and somatic dysfunction of cervical region: Secondary | ICD-10-CM | POA: Diagnosis not present

## 2023-11-28 DIAGNOSIS — M62830 Muscle spasm of back: Secondary | ICD-10-CM | POA: Diagnosis not present

## 2023-12-02 DIAGNOSIS — M9902 Segmental and somatic dysfunction of thoracic region: Secondary | ICD-10-CM | POA: Diagnosis not present

## 2023-12-02 DIAGNOSIS — M62830 Muscle spasm of back: Secondary | ICD-10-CM | POA: Diagnosis not present

## 2023-12-02 DIAGNOSIS — M9901 Segmental and somatic dysfunction of cervical region: Secondary | ICD-10-CM | POA: Diagnosis not present

## 2023-12-02 DIAGNOSIS — M9903 Segmental and somatic dysfunction of lumbar region: Secondary | ICD-10-CM | POA: Diagnosis not present

## 2023-12-03 DIAGNOSIS — M9902 Segmental and somatic dysfunction of thoracic region: Secondary | ICD-10-CM | POA: Diagnosis not present

## 2023-12-03 DIAGNOSIS — M62830 Muscle spasm of back: Secondary | ICD-10-CM | POA: Diagnosis not present

## 2023-12-03 DIAGNOSIS — M9903 Segmental and somatic dysfunction of lumbar region: Secondary | ICD-10-CM | POA: Diagnosis not present

## 2023-12-03 DIAGNOSIS — M9901 Segmental and somatic dysfunction of cervical region: Secondary | ICD-10-CM | POA: Diagnosis not present

## 2023-12-05 DIAGNOSIS — M9903 Segmental and somatic dysfunction of lumbar region: Secondary | ICD-10-CM | POA: Diagnosis not present

## 2023-12-05 DIAGNOSIS — M9901 Segmental and somatic dysfunction of cervical region: Secondary | ICD-10-CM | POA: Diagnosis not present

## 2023-12-05 DIAGNOSIS — M62830 Muscle spasm of back: Secondary | ICD-10-CM | POA: Diagnosis not present

## 2023-12-05 DIAGNOSIS — M9902 Segmental and somatic dysfunction of thoracic region: Secondary | ICD-10-CM | POA: Diagnosis not present

## 2023-12-08 DIAGNOSIS — M9903 Segmental and somatic dysfunction of lumbar region: Secondary | ICD-10-CM | POA: Diagnosis not present

## 2023-12-08 DIAGNOSIS — M62830 Muscle spasm of back: Secondary | ICD-10-CM | POA: Diagnosis not present

## 2023-12-08 DIAGNOSIS — M9902 Segmental and somatic dysfunction of thoracic region: Secondary | ICD-10-CM | POA: Diagnosis not present

## 2023-12-08 DIAGNOSIS — M9901 Segmental and somatic dysfunction of cervical region: Secondary | ICD-10-CM | POA: Diagnosis not present

## 2023-12-09 DIAGNOSIS — M62830 Muscle spasm of back: Secondary | ICD-10-CM | POA: Diagnosis not present

## 2023-12-09 DIAGNOSIS — M9902 Segmental and somatic dysfunction of thoracic region: Secondary | ICD-10-CM | POA: Diagnosis not present

## 2023-12-09 DIAGNOSIS — M9903 Segmental and somatic dysfunction of lumbar region: Secondary | ICD-10-CM | POA: Diagnosis not present

## 2023-12-09 DIAGNOSIS — M9901 Segmental and somatic dysfunction of cervical region: Secondary | ICD-10-CM | POA: Diagnosis not present

## 2023-12-10 DIAGNOSIS — M9903 Segmental and somatic dysfunction of lumbar region: Secondary | ICD-10-CM | POA: Diagnosis not present

## 2023-12-10 DIAGNOSIS — M9901 Segmental and somatic dysfunction of cervical region: Secondary | ICD-10-CM | POA: Diagnosis not present

## 2023-12-10 DIAGNOSIS — M9902 Segmental and somatic dysfunction of thoracic region: Secondary | ICD-10-CM | POA: Diagnosis not present

## 2023-12-10 DIAGNOSIS — M62830 Muscle spasm of back: Secondary | ICD-10-CM | POA: Diagnosis not present

## 2023-12-16 DIAGNOSIS — M62830 Muscle spasm of back: Secondary | ICD-10-CM | POA: Diagnosis not present

## 2023-12-16 DIAGNOSIS — M9901 Segmental and somatic dysfunction of cervical region: Secondary | ICD-10-CM | POA: Diagnosis not present

## 2023-12-16 DIAGNOSIS — M9902 Segmental and somatic dysfunction of thoracic region: Secondary | ICD-10-CM | POA: Diagnosis not present

## 2023-12-16 DIAGNOSIS — M9903 Segmental and somatic dysfunction of lumbar region: Secondary | ICD-10-CM | POA: Diagnosis not present

## 2023-12-17 DIAGNOSIS — M9903 Segmental and somatic dysfunction of lumbar region: Secondary | ICD-10-CM | POA: Diagnosis not present

## 2023-12-17 DIAGNOSIS — M9902 Segmental and somatic dysfunction of thoracic region: Secondary | ICD-10-CM | POA: Diagnosis not present

## 2023-12-17 DIAGNOSIS — M9901 Segmental and somatic dysfunction of cervical region: Secondary | ICD-10-CM | POA: Diagnosis not present

## 2023-12-17 DIAGNOSIS — M62830 Muscle spasm of back: Secondary | ICD-10-CM | POA: Diagnosis not present

## 2023-12-18 NOTE — Progress Notes (Addendum)
 [facility] [address] [place], [place] [address] [phone]  Date:  12/22/2023   Name:  Douglas Nelson   DOB:  [birth year]   MRN:  [id number]  PCP:  Watt Harlene BROCKS, MD    Chief Complaint: Annual Exam   History of Present Illness:  Douglas Nelson is a 38 y.o. very pleasant male patient who presents with the following:  Patient seen today for physical exam.  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  He is now status post subtalar fusion with iliac crest bone graft.  He does use a cane, uses oxycodone  for chronic pain.  It is about the same - stable but painful.  He feels like other joints might be getting a bit worse over the years due to compensating for the ankle.  He has tried to get his diet under better control and is getting exercise on a regular basis   Married to [place].  They have 3 children, 1 in high school and the older 2 are young adults.  Their daughter recently got married.  This was a stressful events and they are glad to have a behind him  Flu vaccine- give today  Recommend COVID booster this fall Can update labs today, needs UDS Reviewed PDMP, he fills oxycodone  5 mg, #60 every 4 to 8 weeks  Discussed the use of AI scribe software for clinical note transcription with the patient, who gave verbal consent to proceed.  History of Present Illness Douglas Nelson is a 38 year old male who presents with a growing cyst on the coccyx and lower back pain.  He reports a cyst on the coccyx that he feels has gotten larger over the past year, and he estimates its current size to be about two to two and a half inches. The cyst causes significant discomfort, especially when lying down, due to its awkward location, making it difficult to find a comfortable position. He describes the  discomfort as 'super uncomfortable' and notes it has become more bothersome over time.  We got an ultrasound and then an MRI of this area last year.  MRI consistent with a sebaceous or pilonidal cyst.  It was not bothering him then so he wanted to leave it alone, but now it is more bothersome  He has been experiencing lower back pain for the past month and has been visiting a chiropractor for relief. He recalls that physical therapy in the past was beneficial and is considering resuming exercises at home to alleviate symptoms. He is seeking a list of recommended stretches and exercises from previous physical therapy sessions.  He reports stable symptoms regarding his foot and ankle, which he manages with ice and occasional use of a brace. He uses a cane for daily mobility as wearing a brace all day is uncomfortable.  He mentions a history of stress and comfort eating, particularly consuming a lot of ice cream despite being lactose intolerant. He has found that taking turmeric and apple cider vinegar supplements helps him process dairy better, although this has led to increased consumption.  No chest pain or difficulty breathing. His digestion has been generally good, aside from issues related to lactose intolerance.   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   Arthritis 2013   Chronic foot pain     Past Surgical History:  Procedure Laterality Date   FRACTURE SURGERY  2003   Left hand/wrist   SUBTALAR DISTRACTION BONE BLOCK FUSION WITH ILIAC CREST BONE GRAFT Left     Social History   Tobacco Use    Smoking status: Former    Current packs/day: 0.00    Types: Cigarettes    Quit date: 08/30/2010    Years since quitting: 13.3   Smokeless tobacco: Never  Vaping Use   Vaping status: Never Used  Substance Use Topics   Alcohol use: Not Currently    Comment: occasionally   Drug use: No    Family History  Problem Relation Age of Onset   Cancer Mother        Ovarian   Depression Mother    Anuerysm Father    Hypertension Father    Arthritis Father    Cancer Father    Hypertension Maternal Grandmother    Diabetes Maternal Grandmother    Hypertension Maternal Grandfather    Stroke Maternal Grandfather    Depression Sister    Depression Brother     No Known Allergies  Medication list has been reviewed and updated.  Current Outpatient Medications on File Prior to Visit  Medication Sig Dispense Refill   b complex vitamins capsule Take 1 capsule by mouth daily.     cetirizine  (ZYRTEC ) 10 MG tablet Take 1 tablet (10 mg total) by mouth daily. (Patient taking differently: Take 10 mg by mouth daily. Pt alternates between Claritin  and Zyrtec  every 6 months.) 90 tablet 1   fluconazole  (DIFLUCAN ) 150 MG tablet Take 2 pills (300 mg) by mouth once, repeat in 1 week 4 tablet 0   Multiple Vitamin (MULTIVITAMIN) tablet Take 1 tablet by mouth daily.     oxyCODONE  (OXY IR/ROXICODONE ) 5 MG immediate release tablet Take 1 tablet (5 mg total) by mouth every 8 (eight) hours as needed. 60 tablet 0   oxyCODONE  (ROXICODONE ) 5 MG immediate release tablet Take 1 tablet (5 mg total) by mouth every 8 (eight) hours as needed for severe pain (pain score 7-10). 60 tablet 0   oxyCODONE  (ROXICODONE ) 5 MG immediate release tablet Take 1 tablet (5 mg total) by mouth every 8 (eight) hours as needed for severe pain (pain score 7-10). 60 tablet 0   Probiotic Product (PROBIOTIC ADVANCED PO) Take by mouth.     zinc gluconate 50 MG tablet Take 50 mg by mouth daily.     No current facility-administered medications on  file prior to visit.    Review of Systems:  As per HPI- otherwise negative.   Physical Examination: Vitals:   12/22/23 1035  BP: 128/64  Pulse: (!) 57  Temp: 98.1 F (36.7 C)  SpO2: 97%   Vitals:   12/22/23 1035  Weight: 192 lb 12.8 oz (87.5 kg)  Height: 6' (1.829 m)   Body mass index is 26.15 kg/m. Ideal Body Weight: Weight in (lb) to have BMI = 25: 183.9  GEN: no acute distress. Normal weight, uses cane. Looks well  HEENT: Atraumatic, Normocephalic.  Ears and  Nose: No external deformity. CV: RRR, No M/G/R. No JVD. No thrill. No extra heart sounds. PULM: CTA B, no wheezes, crackles, rhonchi. No retractions. No resp. distress. No accessory muscle use. ABD: S, NT, ND, +BS. No rebound. No HSM. EXTR: No c/c/e PSYCH: Normally interactive. Conversant.  Cyst over coccyx has enlarged in size, it is mobile and not tender to suggest infection  Assessment and Plan: Physical exam  Screening for diabetes mellitus  Screening for deficiency anemia  Screening for hyperlipidemia  Chronic pain syndrome  Assessment & Plan Adult Wellness Visit Annual wellness visit completed. Family history of prostate cancer noted; screening to start at age 19. - Perform blood work including annual drug screen for chronic pain management - Administer flu shot.  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 Occasional brace use for mobility.  Chronic pain syndrome - Refill oxycodone  prescription.  Enlarging coccygeal cyst Cyst increased in size, causing discomfort. Previous imaging showed no infection. Discussed surgical intervention; prefers home PT exercises first. - Refer to general surgery for evaluation of coccygeal cyst. - Provide list of recommended PT exercises if available.  Low back pain Low back pain, especially in coccyx area. Chiropractic care sought. Plans to resume home PT exercises. - Provide list of  recommended PT exercises if available.  Lactose intolerance Managed with dietary modifications. Uses apple cider vinegar and turmeric supplements for digestion with dairy.  Signed Harlene Schroeder, MD  Received labs, message to patient  Results for orders placed or performed in visit on 12/22/23  CBC   Collection Time: 12/22/23 11:12 AM  Result Value Ref Range   WBC 3.4 (L) 4.0 - 10.5 K/uL   RBC 5.06 4.22 - 5.81 Mil/uL   Platelets 167.0 150.0 - 400.0 K/uL   Hemoglobin 15.0 13.0 - 17.0 g/dL   HCT 55.0 60.9 - 47.9 %   MCV 88.8 78.0 - 100.0 fl   MCHC 33.4 30.0 - 36.0 g/dL   RDW 87.1 88.4 - 84.4 %  Comprehensive metabolic panel with GFR   Collection Time: 12/22/23 11:12 AM  Result Value Ref Range   Sodium 139 135 - 145 mEq/L   Potassium 4.7 3.5 - 5.1 mEq/L   Chloride 100 96 - 112 mEq/L   CO2 34 (H) 19 - 32 mEq/L   Glucose, Bld 94 70 - 99 mg/dL   BUN 8 6 - 23 mg/dL   Creatinine, Ser 8.99 0.40 - 1.50 mg/dL   Total Bilirubin 1.1 0.2 - 1.2 mg/dL   Alkaline Phosphatase 53 39 - 117 U/L   AST 39 (H) 0 - 37 U/L   ALT 28 0 - 53 U/L   Total Protein 7.1 6.0 - 8.3 g/dL   Albumin 5.0 3.5 - 5.2 g/dL   GFR 03.73 >39.99 mL/min   Calcium 10.1 8.4 - 10.5 mg/dL  Hemoglobin J8r   Collection Time: 12/22/23 11:12 AM  Result Value Ref Range   Hgb A1c MFr Bld 5.3 4.6 - 6.5 %  Lipid panel   Collection Time: 12/22/23 11:12 AM  Result Value Ref Range   Cholesterol 321 (H) 0 - 200 mg/dL   Triglycerides 12.9 0.0 - 149.0 mg/dL   HDL 16.69 >60.99 mg/dL   VLDL 82.5 0.0 - 59.9 mg/dL   LDL Cholesterol 779 (H) 0 - 99 mg/dL   Total CHOL/HDL Ratio 4    NonHDL 237.48

## 2023-12-18 NOTE — Patient Instructions (Addendum)
 It was great to see again today, I will be in touch with your labs asap Flu shot today I will set you up with general surgery to take a look at the cyst on your tailbone- they may be able to easily remove it for you!

## 2023-12-22 ENCOUNTER — Ambulatory Visit: Payer: Medicare HMO | Admitting: Family Medicine

## 2023-12-22 ENCOUNTER — Encounter: Payer: Self-pay | Admitting: Family Medicine

## 2023-12-22 VITALS — BP 128/64 | HR 57 | Temp 98.1°F | Ht 72.0 in | Wt 192.8 lb

## 2023-12-22 DIAGNOSIS — Z131 Encounter for screening for diabetes mellitus: Secondary | ICD-10-CM | POA: Diagnosis not present

## 2023-12-22 DIAGNOSIS — Z23 Encounter for immunization: Secondary | ICD-10-CM

## 2023-12-22 DIAGNOSIS — D709 Neutropenia, unspecified: Secondary | ICD-10-CM

## 2023-12-22 DIAGNOSIS — Z130 Encounter for screening for diseases of the blood and blood-forming organs and certain disorders involving the immune mechanism: Secondary | ICD-10-CM | POA: Diagnosis not present

## 2023-12-22 DIAGNOSIS — Z13220 Encounter for screening for lipoid disorders: Secondary | ICD-10-CM | POA: Diagnosis not present

## 2023-12-22 DIAGNOSIS — L729 Follicular cyst of the skin and subcutaneous tissue, unspecified: Secondary | ICD-10-CM

## 2023-12-22 DIAGNOSIS — G894 Chronic pain syndrome: Secondary | ICD-10-CM

## 2023-12-22 DIAGNOSIS — Z0000 Encounter for general adult medical examination without abnormal findings: Secondary | ICD-10-CM | POA: Diagnosis not present

## 2023-12-22 LAB — CBC
HCT: 44.9 % (ref 39.0–52.0)
Hemoglobin: 15 g/dL (ref 13.0–17.0)
MCHC: 33.4 g/dL (ref 30.0–36.0)
MCV: 88.8 fl (ref 78.0–100.0)
Platelets: 167 K/uL (ref 150.0–400.0)
RBC: 5.06 Mil/uL (ref 4.22–5.81)
RDW: 12.8 % (ref 11.5–15.5)
WBC: 3.4 K/uL — ABNORMAL LOW (ref 4.0–10.5)

## 2023-12-22 LAB — COMPREHENSIVE METABOLIC PANEL WITH GFR
ALT: 28 U/L (ref 0–53)
AST: 39 U/L — ABNORMAL HIGH (ref 0–37)
Albumin: 5 g/dL (ref 3.5–5.2)
Alkaline Phosphatase: 53 U/L (ref 39–117)
BUN: 8 mg/dL (ref 6–23)
CO2: 34 meq/L — ABNORMAL HIGH (ref 19–32)
Calcium: 10.1 mg/dL (ref 8.4–10.5)
Chloride: 100 meq/L (ref 96–112)
Creatinine, Ser: 1 mg/dL (ref 0.40–1.50)
GFR: 96.26 mL/min (ref >60.00)
Glucose, Bld: 94 mg/dL (ref 70–99)
Potassium: 4.7 meq/L (ref 3.5–5.1)
Sodium: 139 meq/L (ref 135–145)
Total Bilirubin: 1.1 mg/dL (ref 0.2–1.2)
Total Protein: 7.1 g/dL (ref 6.0–8.3)

## 2023-12-22 LAB — LIPID PANEL
Cholesterol: 321 mg/dL — ABNORMAL HIGH (ref 0–200)
HDL: 83.3 mg/dL (ref >39.00)
LDL Cholesterol: 220 mg/dL — ABNORMAL HIGH (ref 0–99)
NonHDL: 237.48
Total CHOL/HDL Ratio: 4
Triglycerides: 87 mg/dL (ref 0.0–149.0)
VLDL: 17.4 mg/dL (ref 0.0–40.0)

## 2023-12-22 LAB — HEMOGLOBIN A1C: Hgb A1c MFr Bld: 5.3 % (ref 4.6–6.5)

## 2023-12-22 MED ORDER — OXYCODONE HCL 5 MG PO TABS: 5.0000 mg | ORAL_TABLET | Freq: Three times a day (TID) | ORAL | 0 refills | Status: DC | PRN

## 2023-12-22 NOTE — Addendum Note (Signed)
 Addended by: WATT RAISIN C on: 12/22/2023 05:47 PM   Modules accepted: Orders

## 2023-12-24 DIAGNOSIS — M9904 Segmental and somatic dysfunction of sacral region: Secondary | ICD-10-CM | POA: Diagnosis not present

## 2023-12-24 DIAGNOSIS — M9901 Segmental and somatic dysfunction of cervical region: Secondary | ICD-10-CM | POA: Diagnosis not present

## 2023-12-24 DIAGNOSIS — M25512 Pain in left shoulder: Secondary | ICD-10-CM | POA: Diagnosis not present

## 2023-12-24 DIAGNOSIS — M5442 Lumbago with sciatica, left side: Secondary | ICD-10-CM | POA: Diagnosis not present

## 2023-12-24 DIAGNOSIS — M62830 Muscle spasm of back: Secondary | ICD-10-CM | POA: Diagnosis not present

## 2023-12-24 DIAGNOSIS — M9903 Segmental and somatic dysfunction of lumbar region: Secondary | ICD-10-CM | POA: Diagnosis not present

## 2023-12-24 DIAGNOSIS — M9902 Segmental and somatic dysfunction of thoracic region: Secondary | ICD-10-CM | POA: Diagnosis not present

## 2023-12-24 DIAGNOSIS — M9905 Segmental and somatic dysfunction of pelvic region: Secondary | ICD-10-CM | POA: Diagnosis not present

## 2023-12-24 DIAGNOSIS — M5441 Lumbago with sciatica, right side: Secondary | ICD-10-CM | POA: Diagnosis not present

## 2023-12-24 LAB — DRUG MONITORING, PANEL 8 WITH CONFIRMATION, URINE
6 Acetylmorphine: NEGATIVE ng/mL (ref <10)
Alcohol Metabolites: NEGATIVE ng/mL (ref <500)
Amphetamines: NEGATIVE ng/mL (ref <500)
Benzodiazepines: NEGATIVE ng/mL (ref <100)
Buprenorphine, Urine: NEGATIVE ng/mL (ref <5)
Cocaine Metabolite: NEGATIVE ng/mL (ref <150)
Creatinine: 41.4 mg/dL (ref >=20.0)
MDMA: NEGATIVE ng/mL (ref <500)
Marijuana Metabolite: NEGATIVE ng/mL (ref <20)
Noroxycodone: 115 ng/mL — ABNORMAL HIGH (ref <50)
Opiates: NEGATIVE ng/mL (ref <100)
Oxidant: NEGATIVE ug/mL (ref <200)
Oxycodone: NEGATIVE ng/mL (ref <50)
Oxycodone: POSITIVE ng/mL — AB (ref <100)
Oxymorphone: 93 ng/mL — ABNORMAL HIGH (ref <50)
pH: 7.7 (ref 4.5–9.0)

## 2023-12-24 LAB — DM TEMPLATE

## 2023-12-30 DIAGNOSIS — M9905 Segmental and somatic dysfunction of pelvic region: Secondary | ICD-10-CM | POA: Diagnosis not present

## 2023-12-30 DIAGNOSIS — M62830 Muscle spasm of back: Secondary | ICD-10-CM | POA: Diagnosis not present

## 2023-12-30 DIAGNOSIS — M9902 Segmental and somatic dysfunction of thoracic region: Secondary | ICD-10-CM | POA: Diagnosis not present

## 2023-12-30 DIAGNOSIS — M9903 Segmental and somatic dysfunction of lumbar region: Secondary | ICD-10-CM | POA: Diagnosis not present

## 2023-12-30 DIAGNOSIS — M9901 Segmental and somatic dysfunction of cervical region: Secondary | ICD-10-CM | POA: Diagnosis not present

## 2023-12-30 DIAGNOSIS — M5441 Lumbago with sciatica, right side: Secondary | ICD-10-CM | POA: Diagnosis not present

## 2023-12-30 DIAGNOSIS — M5442 Lumbago with sciatica, left side: Secondary | ICD-10-CM | POA: Diagnosis not present

## 2023-12-30 DIAGNOSIS — M25512 Pain in left shoulder: Secondary | ICD-10-CM | POA: Diagnosis not present

## 2023-12-30 DIAGNOSIS — M9904 Segmental and somatic dysfunction of sacral region: Secondary | ICD-10-CM | POA: Diagnosis not present

## 2024-01-07 DIAGNOSIS — M9901 Segmental and somatic dysfunction of cervical region: Secondary | ICD-10-CM | POA: Diagnosis not present

## 2024-01-07 DIAGNOSIS — M9903 Segmental and somatic dysfunction of lumbar region: Secondary | ICD-10-CM | POA: Diagnosis not present

## 2024-01-07 DIAGNOSIS — M5441 Lumbago with sciatica, right side: Secondary | ICD-10-CM | POA: Diagnosis not present

## 2024-01-07 DIAGNOSIS — M9902 Segmental and somatic dysfunction of thoracic region: Secondary | ICD-10-CM | POA: Diagnosis not present

## 2024-01-07 DIAGNOSIS — M9904 Segmental and somatic dysfunction of sacral region: Secondary | ICD-10-CM | POA: Diagnosis not present

## 2024-01-07 DIAGNOSIS — M25512 Pain in left shoulder: Secondary | ICD-10-CM | POA: Diagnosis not present

## 2024-01-07 DIAGNOSIS — M62830 Muscle spasm of back: Secondary | ICD-10-CM | POA: Diagnosis not present

## 2024-01-07 DIAGNOSIS — M5442 Lumbago with sciatica, left side: Secondary | ICD-10-CM | POA: Diagnosis not present

## 2024-01-07 DIAGNOSIS — M9905 Segmental and somatic dysfunction of pelvic region: Secondary | ICD-10-CM | POA: Diagnosis not present

## 2024-01-09 DIAGNOSIS — M5442 Lumbago with sciatica, left side: Secondary | ICD-10-CM | POA: Diagnosis not present

## 2024-01-09 DIAGNOSIS — M62830 Muscle spasm of back: Secondary | ICD-10-CM | POA: Diagnosis not present

## 2024-01-09 DIAGNOSIS — M9905 Segmental and somatic dysfunction of pelvic region: Secondary | ICD-10-CM | POA: Diagnosis not present

## 2024-01-09 DIAGNOSIS — M5441 Lumbago with sciatica, right side: Secondary | ICD-10-CM | POA: Diagnosis not present

## 2024-01-09 DIAGNOSIS — M25512 Pain in left shoulder: Secondary | ICD-10-CM | POA: Diagnosis not present

## 2024-01-09 DIAGNOSIS — M9904 Segmental and somatic dysfunction of sacral region: Secondary | ICD-10-CM | POA: Diagnosis not present

## 2024-01-09 DIAGNOSIS — M9903 Segmental and somatic dysfunction of lumbar region: Secondary | ICD-10-CM | POA: Diagnosis not present

## 2024-01-09 DIAGNOSIS — M9901 Segmental and somatic dysfunction of cervical region: Secondary | ICD-10-CM | POA: Diagnosis not present

## 2024-01-09 DIAGNOSIS — M9902 Segmental and somatic dysfunction of thoracic region: Secondary | ICD-10-CM | POA: Diagnosis not present

## 2024-01-21 DIAGNOSIS — M9904 Segmental and somatic dysfunction of sacral region: Secondary | ICD-10-CM | POA: Diagnosis not present

## 2024-01-21 DIAGNOSIS — M9902 Segmental and somatic dysfunction of thoracic region: Secondary | ICD-10-CM | POA: Diagnosis not present

## 2024-01-21 DIAGNOSIS — M5441 Lumbago with sciatica, right side: Secondary | ICD-10-CM | POA: Diagnosis not present

## 2024-01-21 DIAGNOSIS — M9903 Segmental and somatic dysfunction of lumbar region: Secondary | ICD-10-CM | POA: Diagnosis not present

## 2024-01-21 DIAGNOSIS — M5442 Lumbago with sciatica, left side: Secondary | ICD-10-CM | POA: Diagnosis not present

## 2024-01-21 DIAGNOSIS — M9901 Segmental and somatic dysfunction of cervical region: Secondary | ICD-10-CM | POA: Diagnosis not present

## 2024-01-21 DIAGNOSIS — M25512 Pain in left shoulder: Secondary | ICD-10-CM | POA: Diagnosis not present

## 2024-01-21 DIAGNOSIS — M62830 Muscle spasm of back: Secondary | ICD-10-CM | POA: Diagnosis not present

## 2024-01-21 DIAGNOSIS — M9905 Segmental and somatic dysfunction of pelvic region: Secondary | ICD-10-CM | POA: Diagnosis not present

## 2024-02-04 DIAGNOSIS — M25512 Pain in left shoulder: Secondary | ICD-10-CM | POA: Diagnosis not present

## 2024-02-04 DIAGNOSIS — M9902 Segmental and somatic dysfunction of thoracic region: Secondary | ICD-10-CM | POA: Diagnosis not present

## 2024-02-04 DIAGNOSIS — M5441 Lumbago with sciatica, right side: Secondary | ICD-10-CM | POA: Diagnosis not present

## 2024-02-04 DIAGNOSIS — M9904 Segmental and somatic dysfunction of sacral region: Secondary | ICD-10-CM | POA: Diagnosis not present

## 2024-02-04 DIAGNOSIS — M9903 Segmental and somatic dysfunction of lumbar region: Secondary | ICD-10-CM | POA: Diagnosis not present

## 2024-02-04 DIAGNOSIS — M62830 Muscle spasm of back: Secondary | ICD-10-CM | POA: Diagnosis not present

## 2024-02-04 DIAGNOSIS — M9901 Segmental and somatic dysfunction of cervical region: Secondary | ICD-10-CM | POA: Diagnosis not present

## 2024-02-04 DIAGNOSIS — M9905 Segmental and somatic dysfunction of pelvic region: Secondary | ICD-10-CM | POA: Diagnosis not present

## 2024-02-04 DIAGNOSIS — M5442 Lumbago with sciatica, left side: Secondary | ICD-10-CM | POA: Diagnosis not present

## 2024-02-06 ENCOUNTER — Encounter: Payer: Self-pay | Admitting: Family Medicine

## 2024-02-06 DIAGNOSIS — G894 Chronic pain syndrome: Secondary | ICD-10-CM

## 2024-02-06 MED ORDER — OXYCODONE HCL 5 MG PO TABS: 5.0000 mg | ORAL_TABLET | Freq: Three times a day (TID) | ORAL | 0 refills | Status: DC | PRN

## 2024-02-11 DIAGNOSIS — M9901 Segmental and somatic dysfunction of cervical region: Secondary | ICD-10-CM | POA: Diagnosis not present

## 2024-02-11 DIAGNOSIS — M9902 Segmental and somatic dysfunction of thoracic region: Secondary | ICD-10-CM | POA: Diagnosis not present

## 2024-02-11 DIAGNOSIS — M25512 Pain in left shoulder: Secondary | ICD-10-CM | POA: Diagnosis not present

## 2024-02-11 DIAGNOSIS — M9904 Segmental and somatic dysfunction of sacral region: Secondary | ICD-10-CM | POA: Diagnosis not present

## 2024-02-11 DIAGNOSIS — M5442 Lumbago with sciatica, left side: Secondary | ICD-10-CM | POA: Diagnosis not present

## 2024-02-11 DIAGNOSIS — M62830 Muscle spasm of back: Secondary | ICD-10-CM | POA: Diagnosis not present

## 2024-02-11 DIAGNOSIS — M9903 Segmental and somatic dysfunction of lumbar region: Secondary | ICD-10-CM | POA: Diagnosis not present

## 2024-02-11 DIAGNOSIS — M5441 Lumbago with sciatica, right side: Secondary | ICD-10-CM | POA: Diagnosis not present

## 2024-02-11 DIAGNOSIS — M9905 Segmental and somatic dysfunction of pelvic region: Secondary | ICD-10-CM | POA: Diagnosis not present

## 2024-03-20 ENCOUNTER — Encounter: Payer: Self-pay | Admitting: Family Medicine

## 2024-03-20 DIAGNOSIS — G894 Chronic pain syndrome: Secondary | ICD-10-CM

## 2024-03-21 MED ORDER — OXYCODONE HCL 5 MG PO TABS: 5.0000 mg | ORAL_TABLET | Freq: Three times a day (TID) | ORAL | 0 refills | Status: AC | PRN

## 2024-11-18 ENCOUNTER — Ambulatory Visit

## 2024-12-27 ENCOUNTER — Encounter: Admitting: Family Medicine
